# Patient Record
Sex: Male | Born: 1955 | Race: White | Hispanic: No | Marital: Married | Smoking: Former smoker
Health system: Southern US, Community
[De-identification: ages and names within clinical notes are randomized; demographics above are authoritative.]

## PROBLEM LIST (undated history)

## (undated) ENCOUNTER — Encounter: Attending: Internal Medicine | Primary: Internal Medicine

## (undated) ENCOUNTER — Encounter: Attending: Medical | Primary: Medical

## (undated) ENCOUNTER — Ambulatory Visit: Payer: PRIVATE HEALTH INSURANCE

## (undated) ENCOUNTER — Encounter

## (undated) ENCOUNTER — Telehealth

## (undated) ENCOUNTER — Ambulatory Visit

## (undated) ENCOUNTER — Encounter: Attending: Family | Primary: Family

## (undated) ENCOUNTER — Other Ambulatory Visit

## (undated) ENCOUNTER — Telehealth: Attending: Internal Medicine | Primary: Internal Medicine

## (undated) ENCOUNTER — Ambulatory Visit: Payer: MEDICARE | Attending: Family | Primary: Family

## (undated) ENCOUNTER — Encounter
Attending: Student in an Organized Health Care Education/Training Program | Primary: Student in an Organized Health Care Education/Training Program

## (undated) ENCOUNTER — Ambulatory Visit
Payer: MEDICARE | Attending: Student in an Organized Health Care Education/Training Program | Primary: Student in an Organized Health Care Education/Training Program

## (undated) ENCOUNTER — Ambulatory Visit: Attending: Pharmacist | Primary: Pharmacist

## (undated) ENCOUNTER — Encounter: Attending: Mental Health | Primary: Mental Health

## (undated) ENCOUNTER — Ambulatory Visit: Payer: PRIVATE HEALTH INSURANCE | Attending: Family Medicine | Primary: Family Medicine

## (undated) ENCOUNTER — Ambulatory Visit: Payer: PRIVATE HEALTH INSURANCE | Attending: Medical | Primary: Medical

## (undated) ENCOUNTER — Ambulatory Visit
Attending: Student in an Organized Health Care Education/Training Program | Primary: Student in an Organized Health Care Education/Training Program

## (undated) ENCOUNTER — Telehealth
Attending: Student in an Organized Health Care Education/Training Program | Primary: Student in an Organized Health Care Education/Training Program

## (undated) ENCOUNTER — Ambulatory Visit: Payer: MEDICARE | Attending: Internal Medicine | Primary: Internal Medicine

## (undated) ENCOUNTER — Ambulatory Visit: Payer: MEDICARE

## (undated) ENCOUNTER — Ambulatory Visit
Payer: PRIVATE HEALTH INSURANCE | Attending: Rehabilitative and Restorative Service Providers" | Primary: Rehabilitative and Restorative Service Providers"

## (undated) ENCOUNTER — Ambulatory Visit: Attending: Internal Medicine | Primary: Internal Medicine

## (undated) ENCOUNTER — Telehealth: Attending: Medical | Primary: Medical

## (undated) ENCOUNTER — Telehealth: Attending: Physician Assistant | Primary: Physician Assistant

## (undated) ENCOUNTER — Telehealth: Attending: Family | Primary: Family

## (undated) ENCOUNTER — Ambulatory Visit: Payer: MEDICARE | Attending: Physician Assistant | Primary: Physician Assistant

## (undated) ENCOUNTER — Ambulatory Visit: Payer: PRIVATE HEALTH INSURANCE | Attending: Internal Medicine | Primary: Internal Medicine

## (undated) ENCOUNTER — Ambulatory Visit: Payer: PRIVATE HEALTH INSURANCE | Attending: Mental Health | Primary: Mental Health

## (undated) DIAGNOSIS — I2510 Atherosclerotic heart disease of native coronary artery without angina pectoris: Secondary | ICD-10-CM

## (undated) DIAGNOSIS — G2581 Restless legs syndrome: Secondary | ICD-10-CM

## (undated) DIAGNOSIS — E7800 Pure hypercholesterolemia, unspecified: Secondary | ICD-10-CM

## (undated) DIAGNOSIS — M1990 Unspecified osteoarthritis, unspecified site: Secondary | ICD-10-CM

## (undated) DIAGNOSIS — I10 Essential (primary) hypertension: Secondary | ICD-10-CM

## (undated) DIAGNOSIS — R079 Chest pain, unspecified: Secondary | ICD-10-CM

## (undated) DIAGNOSIS — IMO0002 Reserved for concepts with insufficient information to code with codable children: Secondary | ICD-10-CM

## (undated) DIAGNOSIS — Z9861 Coronary angioplasty status: Secondary | ICD-10-CM

## (undated) DIAGNOSIS — Z789 Other specified health status: Secondary | ICD-10-CM

## (undated) DIAGNOSIS — E669 Obesity, unspecified: Secondary | ICD-10-CM

## (undated) DIAGNOSIS — E785 Hyperlipidemia, unspecified: Secondary | ICD-10-CM

## (undated) DIAGNOSIS — N419 Inflammatory disease of prostate, unspecified: Secondary | ICD-10-CM

## (undated) DIAGNOSIS — G4730 Sleep apnea, unspecified: Secondary | ICD-10-CM

## (undated) DIAGNOSIS — I519 Heart disease, unspecified: Secondary | ICD-10-CM

## (undated) DIAGNOSIS — Z79899 Other long term (current) drug therapy: Secondary | ICD-10-CM

## (undated) DIAGNOSIS — K5792 Diverticulitis of intestine, part unspecified, without perforation or abscess without bleeding: Secondary | ICD-10-CM

## (undated) DIAGNOSIS — I252 Old myocardial infarction: Secondary | ICD-10-CM

## (undated) DIAGNOSIS — Z9889 Other specified postprocedural states: Secondary | ICD-10-CM

## (undated) DIAGNOSIS — E119 Type 2 diabetes mellitus without complications: Secondary | ICD-10-CM

## (undated) HISTORY — DX: Other specified postprocedural states: Z98.890

## (undated) HISTORY — DX: Heart disease, unspecified: I51.9

## (undated) HISTORY — DX: Atherosclerotic heart disease of native coronary artery without angina pectoris: I25.10

## (undated) HISTORY — PX: KNEE ARTHROSCOPY: SHX127

## (undated) HISTORY — PX: VASECTOMY: SHX75

## (undated) HISTORY — DX: Unspecified osteoarthritis, unspecified site: M19.90

## (undated) HISTORY — PX: LUMBAR FUSION: SHX111

## (undated) HISTORY — DX: Other long term (current) drug therapy: Z79.899

## (undated) HISTORY — DX: Sleep apnea, unspecified: G47.30

## (undated) HISTORY — DX: Other specified health status: Z78.9

## (undated) HISTORY — DX: Restless legs syndrome: G25.81

## (undated) HISTORY — DX: Inflammatory disease of prostate, unspecified: N41.9

## (undated) HISTORY — PX: OTHER SURGICAL HISTORY: SHX169

## (undated) HISTORY — DX: Obesity, unspecified: E66.9

## (undated) HISTORY — DX: Essential (primary) hypertension: I10

## (undated) HISTORY — DX: Chest pain, unspecified: R07.9

## (undated) HISTORY — DX: Pure hypercholesterolemia, unspecified: E78.00

## (undated) HISTORY — DX: Diverticulitis of intestine, part unspecified, without perforation or abscess without bleeding: K57.92

## (undated) HISTORY — PX: HERNIA REPAIR: SHX51

## (undated) HISTORY — DX: Hyperlipidemia, unspecified: E78.5

## (undated) HISTORY — PX: SHOULDER SURGERY: SHX246

## (undated) HISTORY — DX: Coronary angioplasty status: Z98.61

## (undated) HISTORY — DX: Reserved for concepts with insufficient information to code with codable children: IMO0002

## (undated) HISTORY — DX: Old myocardial infarction: I25.2

## (undated) HISTORY — PX: COLON RESECTION: SHX5231

## (undated) HISTORY — PX: CATARACT EXTRACTION: SUR2

---

## 2004-03-02 HISTORY — PX: OTHER SURGICAL HISTORY: SHX169

## 2010-06-17 ENCOUNTER — Inpatient Hospital Stay (HOSPITAL_COMMUNITY)
Admission: EM | Admit: 2010-06-17 | Discharge: 2010-06-25 | DRG: 234 | Disposition: A | Discharge status: Home / Self Care | Payer: Managed Care, Other (non HMO) | Source: Other Acute Inpatient Hospital | Attending: Thoracic Surgery (Cardiothoracic Vascular Surgery) | Admitting: Thoracic Surgery (Cardiothoracic Vascular Surgery)

## 2010-06-17 ENCOUNTER — Inpatient Hospital Stay (HOSPITAL_COMMUNITY): Payer: Managed Care, Other (non HMO)

## 2010-06-17 DIAGNOSIS — G4733 Obstructive sleep apnea (adult) (pediatric): Secondary | ICD-10-CM | POA: Diagnosis present

## 2010-06-17 DIAGNOSIS — J90 Pleural effusion, not elsewhere classified: Secondary | ICD-10-CM | POA: Diagnosis not present

## 2010-06-17 DIAGNOSIS — M542 Cervicalgia: Secondary | ICD-10-CM | POA: Diagnosis present

## 2010-06-17 DIAGNOSIS — E669 Obesity, unspecified: Secondary | ICD-10-CM | POA: Diagnosis present

## 2010-06-17 DIAGNOSIS — K5900 Constipation, unspecified: Secondary | ICD-10-CM | POA: Diagnosis present

## 2010-06-17 DIAGNOSIS — J9819 Other pulmonary collapse: Secondary | ICD-10-CM | POA: Diagnosis present

## 2010-06-17 DIAGNOSIS — G2581 Restless legs syndrome: Secondary | ICD-10-CM | POA: Diagnosis present

## 2010-06-17 DIAGNOSIS — M1990 Unspecified osteoarthritis, unspecified site: Secondary | ICD-10-CM | POA: Diagnosis present

## 2010-06-17 DIAGNOSIS — Z01810 Encounter for preprocedural cardiovascular examination: Secondary | ICD-10-CM

## 2010-06-17 DIAGNOSIS — E8779 Other fluid overload: Secondary | ICD-10-CM | POA: Diagnosis not present

## 2010-06-17 DIAGNOSIS — D649 Anemia, unspecified: Secondary | ICD-10-CM | POA: Diagnosis not present

## 2010-06-17 DIAGNOSIS — G8929 Other chronic pain: Secondary | ICD-10-CM | POA: Diagnosis present

## 2010-06-17 DIAGNOSIS — Z9861 Coronary angioplasty status: Secondary | ICD-10-CM

## 2010-06-17 DIAGNOSIS — I10 Essential (primary) hypertension: Secondary | ICD-10-CM | POA: Diagnosis present

## 2010-06-17 DIAGNOSIS — Z981 Arthrodesis status: Secondary | ICD-10-CM

## 2010-06-17 DIAGNOSIS — I2510 Atherosclerotic heart disease of native coronary artery without angina pectoris: Secondary | ICD-10-CM | POA: Diagnosis present

## 2010-06-17 DIAGNOSIS — R7309 Other abnormal glucose: Secondary | ICD-10-CM | POA: Diagnosis present

## 2010-06-17 DIAGNOSIS — I252 Old myocardial infarction: Secondary | ICD-10-CM

## 2010-06-17 DIAGNOSIS — Z7982 Long term (current) use of aspirin: Secondary | ICD-10-CM

## 2010-06-17 DIAGNOSIS — E876 Hypokalemia: Secondary | ICD-10-CM | POA: Diagnosis not present

## 2010-06-17 DIAGNOSIS — I519 Heart disease, unspecified: Secondary | ICD-10-CM | POA: Diagnosis present

## 2010-06-17 DIAGNOSIS — E785 Hyperlipidemia, unspecified: Secondary | ICD-10-CM | POA: Diagnosis present

## 2010-06-17 DIAGNOSIS — D72829 Elevated white blood cell count, unspecified: Secondary | ICD-10-CM | POA: Diagnosis not present

## 2010-06-17 DIAGNOSIS — Z8249 Family history of ischemic heart disease and other diseases of the circulatory system: Secondary | ICD-10-CM

## 2010-06-17 DIAGNOSIS — I214 Non-ST elevation (NSTEMI) myocardial infarction: Principal | ICD-10-CM | POA: Diagnosis present

## 2010-06-17 DIAGNOSIS — I319 Disease of pericardium, unspecified: Secondary | ICD-10-CM | POA: Diagnosis not present

## 2010-06-17 DIAGNOSIS — K219 Gastro-esophageal reflux disease without esophagitis: Secondary | ICD-10-CM | POA: Diagnosis present

## 2010-06-17 LAB — URINALYSIS, ROUTINE W REFLEX MICROSCOPIC
Glucose, UA: NEGATIVE mg/dL
Nitrite: NEGATIVE
Specific Gravity, Urine: 1.02 (ref 1.005–1.030)
pH: 6.5 (ref 5.0–8.0)

## 2010-06-17 LAB — BASIC METABOLIC PANEL
BUN: 14 mg/dL (ref 6–23)
CO2: 25 mEq/L (ref 19–32)
Calcium: 9 mg/dL (ref 8.4–10.5)
Creatinine, Ser: 0.88 mg/dL (ref 0.4–1.5)
GFR calc Af Amer: >60 mL/min (ref >60)
Glucose, Bld: 98 mg/dL (ref 70–99)

## 2010-06-17 LAB — CBC
MCH: 30.7 pg (ref 26.0–34.0)
MCHC: 34.9 g/dL (ref 30.0–36.0)
Platelets: 240 10*3/uL (ref 150–400)
RBC: 4.63 MIL/uL (ref 4.22–5.81)
RDW: 12.9 % (ref 11.5–15.5)

## 2010-06-17 LAB — CARDIAC PANEL(CRET KIN+CKTOT+MB+TROPI)
CK, MB: 24.3 ng/mL (ref 0.3–4.0)
Relative Index: 11.4 — ABNORMAL HIGH (ref 0.0–2.5)
Troponin I: 1.44 ng/mL (ref 0.00–0.06)

## 2010-06-17 LAB — PROTIME-INR: Prothrombin Time: 13.2 seconds (ref 11.6–15.2)

## 2010-06-18 ENCOUNTER — Inpatient Hospital Stay (HOSPITAL_COMMUNITY): Payer: Managed Care, Other (non HMO)

## 2010-06-18 ENCOUNTER — Encounter (HOSPITAL_COMMUNITY): Payer: Self-pay

## 2010-06-18 DIAGNOSIS — I2510 Atherosclerotic heart disease of native coronary artery without angina pectoris: Secondary | ICD-10-CM

## 2010-06-18 LAB — SURGICAL PCR SCREEN: MRSA, PCR: NEGATIVE

## 2010-06-18 LAB — COMPREHENSIVE METABOLIC PANEL
ALT: 26 U/L (ref 0–53)
Albumin: 3.4 g/dL — ABNORMAL LOW (ref 3.5–5.2)
Alkaline Phosphatase: 49 U/L (ref 39–117)
BUN: 11 mg/dL (ref 6–23)
Chloride: 109 mEq/L (ref 96–112)
Glucose, Bld: 106 mg/dL — ABNORMAL HIGH (ref 70–99)
Potassium: 3.9 mEq/L (ref 3.5–5.1)
Sodium: 139 mEq/L (ref 135–145)
Total Bilirubin: 0.7 mg/dL (ref 0.3–1.2)

## 2010-06-18 LAB — CBC
HCT: 39.5 % (ref 39.0–52.0)
MCH: 30.2 pg (ref 26.0–34.0)
MCHC: 34.4 g/dL (ref 30.0–36.0)
MCV: 87.8 fL (ref 78.0–100.0)
Platelets: 243 10*3/uL (ref 150–400)
RDW: 12.8 % (ref 11.5–15.5)
WBC: 7.8 10*3/uL (ref 4.0–10.5)

## 2010-06-18 LAB — LIPID PANEL
HDL: 35 mg/dL — ABNORMAL LOW (ref >39)
LDL Cholesterol: 109 mg/dL — ABNORMAL HIGH (ref 0–99)
Triglycerides: 234 mg/dL — ABNORMAL HIGH (ref <150)

## 2010-06-18 LAB — CARDIAC PANEL(CRET KIN+CKTOT+MB+TROPI): Total CK: 186 U/L (ref 7–232)

## 2010-06-18 NOTE — Consult Note (Signed)
NAME:  [name], [name]                 ACCOUNT NO.:  [id number]  MEDICAL RECORD NO.:  [id number]           PATIENT TYPE:  I  LOCATION:  2904                         FACILITY:  [facility]  PHYSICIAN:  [name], M.D. DATE OF BIRTH:  1955-09-24  DATE OF CONSULTATION:  06/17/2010 DATE OF DISCHARGE:                                CONSULTATION   REQUESTING PHYSICIAN:  Peter M. Swaziland, MD  REASON FOR CONSULTATION:  Severe three-vessel coronary artery disease, status post acute non-ST-segment elevation myocardial infarction.  HISTORY OF PRESENT ILLNESS:  [name] is a 55 year old obese white male who recently moved to [place], [place] having previously resided in [place].  The patient has history of coronary artery disease, hypertension, hyperlipidemia, hyperglycemia, and a strong family history of coronary artery disease.  The patient states that he suffered an acute myocardial infarction in 2004 and was treated medically.  He had a second myocardial infarction in 2006 and underwent PCI and stenting of one vessel at that time.  Details of these procedures were not available.  The patient has sought regular medical followup since then and underwent followup catheterization in 2010 that reportedly looked okay.  The patient was in his usual state of health until approximately 1 week ago when he began to develop symptoms of exertional chest pain and worsening fatigue.  These symptoms accelerated rapidly over the course of the week until last night when he was awoke from his sleep with severe substernal chest discomfort occurring at rest.  He took aspirin and nitroglycerin and eventually the chest pain resolved.  He presented to the emergency department at [facility] where electrocardiograms revealed sinus rhythm without any acute ST-T wave changes.  However, cardiac enzymes were abnormal prompting hospitalization and transfer to [facility] for further therapy.   The patient underwent cardiac catheterization today by Dr. Swaziland.  The patient was found to have severe three-vessel coronary artery disease with mild left ventricular dysfunction.  Cardiothoracic surgical consultation was requested.  REVIEW OF SYSTEMS:  GENERAL:  The patient reports normal appetite.  He has been feeling well up until approximately 1 week ago.  CARDIAC: Notable for symptoms of accelerating angina over the past week culminating in the patient's acute presentation last night.  The patient also had one episode of chest pain at rest here following catheterization later this afternoon.  Chest pain was resolved with sublingual nitroglycerin and intravenous heparin.  The patient denies any problems with shortness of breath either with exertion or at rest prior to his acute presentation.  The patient denies PND, orthopnea, or lower extremity edema.  He has not had tachy palpitations or syncope. RESPIRATORY:  Negative.  The patient has been treated for obstructive sleep apnea in the past.  He does not use CPAP.  He denies productive cough, hemoptysis, or wheezing.  GASTROINTESTINAL:  Notable that the patient has no difficulty swallowing.  He reports normal appetite.  He has not been having normal bowel movements.  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  The patient has been having bowel movements and he has not had any fevers, chills, or other significant abdominal pain other than mild occasional crampy pain associated with constipation.  He does have intermittent bright red blood in his stool which apparently is chronic and related to history of hemorrhoids. MUSCULOSKELETAL:  Notable for mild arthritis and arthralgias, particularly afflicting his neck.   GENITOURINARY:  Negative.  HEENT: Negative.  PAST MEDICAL HISTORY: 1. Coronary artery disease. 2. Hypertension. 3. Hyperlipidemia. 4. Hyperglycemia. 5. 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 6. Osteoarthritis. 7. History of ventral incisional hernia, status post repair. 8. Obstructive sleep apnea. 9. Restless legs syndrome. 10.Obesity.  PAST SURGICAL HISTORY: 1. Cervical fusion at C6-C7 2. Left shoulder surgery. 3. Right elbow surgery. 4. Tonsillectomy and uvulectomy.  FAMILY HISTORY:  Notable for strong presence of premature coronary artery disease.  The patient's father died of myocardial infarction at a young age and brother has undergone coronary artery bypass grafting at young age.  SOCIAL HISTORY:  The patient is married, with two children, one who is in college and Theatre stage manager and another who is a Holiday representative in high school.  The patient has just recently relocated to [place], [place] having previously lived in [place].  He works as a Advice worker for [employer].  He has a remote history of tobacco use, but he quit smoking 25 years ago.  MEDICATIONS PRIOR TO ADMISSION: 1. Bystolic 2.5 mg daily. 2. Aspirin.  DRUG ALLERGIES:  FLAGYL with causes hives.  The patient has had some intolerance of statins in the past.  PHYSICAL EXAMINATION:  GENERAL:  The patient is a well-appearing moderately obese male who appears his stated age, in no acute distress. He is currently in sinus rhythm. HEENT:  Unrevealing. NECK:  Supple.  There are no carotid bruits. CHEST:  Auscultation of the chest reveals clear breath sounds which are symmetrical bilaterally.  No wheezes, rales, or rhonchi noted. CARDIOVASCULAR:  Notable for regular rate and rhythm.  No murmurs, rubs, or gallops are appreciated. ABDOMEN:  Soft, moderately obese,  AND nontender.  Bowel sounds are present. EXTREMITIES:  Warm and well perfused.  There is no lower extremity edema.  Distal pulses are easily palpable in both lower legs at the ankle. RECTAL AND GU:  Both deferred. SKIN:  Clean, dry, and healthy-appearing throughout.  DIAGNOSTIC TEST:  Cardiac catheterization performed by Dr. Swaziland is reviewed.  This demonstrates 99% hazy proximal stenosis of the left anterior descending coronary artery arising just after takeoff of the first septal perforator.  There is diffuse disease in the mid and distal left anterior descending coronary artery as well.  There is codominant circulation with a large left circumflex coronary artery.  There is proximal stent in the left circumflex vessel.  There is a 70-80% ostial stenosis of a small to medium size intermediate branch.  There is 80% proximal stenosis of a large obtuse marginal branch.  There is tubular 50% stenosis of the mid left circumflex coronary artery before it gives rise to posterolateral branch.  The right coronary artery is small and chronically occluded.  The terminal branch of the right coronary artery fills faintly via left-to-right collaterals.  Left ventricular function is mild to moderately reduced with severe apical hypokinesis,  but otherwise normal wall motion.  Ejection fraction estimated at 50%.  IMPRESSION:  Severe three-vessel coronary artery disease, status post acute non-ST-segment elevation myocardial infarction.  I agree that [name] would best be treated with surgical revascularization.  The patient also has some symptoms of constipation and obstipation which could be suggestive of partial small or large bowel obstruction given his previous history of severe diverticular disease and multiple surgical procedures.  His abdominal exam is completely benign at this time and admission blood work was normal.  PLAN:  I have discussed options at length with [name] here in  the hospital today.  He understands the indications, risks, and potential benefits of coronary artery bypass grafting.  Alternative treatment strategies have been discussed.  He has asked that his records be sent out for second opinion, but at this point the patient seems to be agreeable in proceeding with surgery.  We will tentatively plan to proceed with surgery on Friday, June 20, 2010.  He understands all associated risks of surgery including but not limited to risk of death, stroke, myocardial infarction, congestive heart failure, respiratory failure, pneumonia, bleeding requiring blood transfusion, arrhythmia, infection, and recurrent coronary artery disease.  All of his questions have been addressed.     [name], M.D.     CHO/MEDQ  D:  06/17/2010  T:  06/18/2010  Job:  [id number]  cc:   Cassell Clement, M.D.  Electronically Signed by Tressie Stalker M.D. on 06/18/2010 08:13:10 AM

## 2010-06-19 DIAGNOSIS — I2510 Atherosclerotic heart disease of native coronary artery without angina pectoris: Secondary | ICD-10-CM

## 2010-06-19 LAB — CBC
HCT: 38.8 % — ABNORMAL LOW (ref 39.0–52.0)
MCH: 30 pg (ref 26.0–34.0)
MCHC: 34.3 g/dL (ref 30.0–36.0)
MCV: 87.4 fL (ref 78.0–100.0)
Platelets: 242 10*3/uL (ref 150–400)
RDW: 12.7 % (ref 11.5–15.5)
WBC: 6.9 10*3/uL (ref 4.0–10.5)

## 2010-06-19 LAB — POCT I-STAT 3, ART BLOOD GAS (G3+)
Acid-Base Excess: 3 mmol/L — ABNORMAL HIGH (ref 0.0–2.0)
Bicarbonate: 26.5 mEq/L — ABNORMAL HIGH (ref 20.0–24.0)
TCO2: 28 mmol/L (ref 0–100)

## 2010-06-19 LAB — ABO/RH: ABO/RH(D): A POS

## 2010-06-19 LAB — HEPARIN LEVEL (UNFRACTIONATED): Heparin Unfractionated: 0.41 IU/mL (ref 0.30–0.70)

## 2010-06-19 LAB — TYPE AND SCREEN: Antibody Screen: NEGATIVE

## 2010-06-20 ENCOUNTER — Inpatient Hospital Stay (HOSPITAL_COMMUNITY): Payer: Managed Care, Other (non HMO)

## 2010-06-20 DIAGNOSIS — I2510 Atherosclerotic heart disease of native coronary artery without angina pectoris: Secondary | ICD-10-CM

## 2010-06-20 HISTORY — PX: CORONARY ARTERY BYPASS GRAFT: SHX141

## 2010-06-20 LAB — POCT I-STAT 3, ART BLOOD GAS (G3+)
Acid-base deficit: 1 mmol/L (ref 0.0–2.0)
Acid-base deficit: 2 mmol/L (ref 0.0–2.0)
Bicarbonate: 25 mEq/L — ABNORMAL HIGH (ref 20.0–24.0)
O2 Saturation: 100 %
O2 Saturation: 92 %
TCO2: 24 mmol/L (ref 0–100)
pCO2 arterial: 38.1 mmHg (ref 35.0–45.0)
pCO2 arterial: 37.6 mmHg (ref 35.0–45.0)
pCO2 arterial: 46.8 mmHg — ABNORMAL HIGH (ref 35.0–45.0)
pCO2 arterial: 46.1 mmHg — ABNORMAL HIGH (ref 35.0–45.0)
pO2, Arterial: 331 mmHg — ABNORMAL HIGH (ref 80.0–100.0)
pO2, Arterial: 65 mmHg — ABNORMAL LOW (ref 80.0–100.0)
pO2, Arterial: 74 mmHg — ABNORMAL LOW (ref 80.0–100.0)
pO2, Arterial: 70 mmHg — ABNORMAL LOW (ref 80.0–100.0)

## 2010-06-20 LAB — BASIC METABOLIC PANEL
Calcium: 9.1 mg/dL (ref 8.4–10.5)
Creatinine, Ser: 1.04 mg/dL (ref 0.4–1.5)
GFR calc Af Amer: >60 mL/min (ref >60)
GFR calc non Af Amer: >60 mL/min (ref >60)

## 2010-06-20 LAB — POCT I-STAT, CHEM 8
BUN: 12 mg/dL (ref 6–23)
Calcium, Ion: 1.18 mmol/L (ref 1.12–1.32)
Chloride: 104 mEq/L (ref 96–112)
Glucose, Bld: 120 mg/dL — ABNORMAL HIGH (ref 70–99)
HCT: 36 % — ABNORMAL LOW (ref 39.0–52.0)
TCO2: 25 mmol/L (ref 0–100)

## 2010-06-20 LAB — CBC
HCT: 36 % — ABNORMAL LOW (ref 39.0–52.0)
MCH: 30.4 pg (ref 26.0–34.0)
MCH: 31.1 pg (ref 26.0–34.0)
MCHC: 34.3 g/dL (ref 30.0–36.0)
MCHC: 34.4 g/dL (ref 30.0–36.0)
MCV: 86.8 fL (ref 78.0–100.0)
MCV: 87.8 fL (ref 78.0–100.0)
Platelets: 251 10*3/uL (ref 150–400)
Platelets: 155 10*3/uL (ref 150–400)
Platelets: 156 10*3/uL (ref 150–400)
RDW: 12.9 % (ref 11.5–15.5)
RDW: 12.6 % (ref 11.5–15.5)
RDW: 12.7 % (ref 11.5–15.5)

## 2010-06-20 LAB — POCT I-STAT 4, (NA,K, GLUC, HGB,HCT)
Glucose, Bld: 113 mg/dL — ABNORMAL HIGH (ref 70–99)
Glucose, Bld: 147 mg/dL — ABNORMAL HIGH (ref 70–99)
Glucose, Bld: 166 mg/dL — ABNORMAL HIGH (ref 70–99)
HCT: 38 % — ABNORMAL LOW (ref 39.0–52.0)
HCT: 29 % — ABNORMAL LOW (ref 39.0–52.0)
HCT: 31 % — ABNORMAL LOW (ref 39.0–52.0)
HCT: 35 % — ABNORMAL LOW (ref 39.0–52.0)
Hemoglobin: 12.9 g/dL — ABNORMAL LOW (ref 13.0–17.0)
Hemoglobin: 11.9 g/dL — ABNORMAL LOW (ref 13.0–17.0)
Potassium: 4.8 mEq/L (ref 3.5–5.1)
Sodium: 137 mEq/L (ref 135–145)

## 2010-06-20 LAB — HEMOGLOBIN AND HEMATOCRIT, BLOOD
HCT: 30.3 % — ABNORMAL LOW (ref 39.0–52.0)
Hemoglobin: 10.6 g/dL — ABNORMAL LOW (ref 13.0–17.0)

## 2010-06-20 LAB — DIFFERENTIAL
Basophils Absolute: 0 10*3/uL (ref 0.0–0.1)
Basophils Relative: 0 % (ref 0–1)
Eosinophils Absolute: 0.5 10*3/uL (ref 0.0–0.7)
Monocytes Absolute: 0.9 10*3/uL (ref 0.1–1.0)
Monocytes Relative: 11 % (ref 3–12)

## 2010-06-20 LAB — POCT I-STAT 3, VENOUS BLOOD GAS (G3P V)
O2 Saturation: 74 %
TCO2: 26 mmol/L (ref 0–100)
pCO2, Ven: 45.5 mmHg (ref 45.0–50.0)
pO2, Ven: 41 mmHg (ref 30.0–45.0)

## 2010-06-20 LAB — POCT I-STAT GLUCOSE
Glucose, Bld: 162 mg/dL — ABNORMAL HIGH (ref 70–99)
Operator id: 3342
Operator id: 3342

## 2010-06-21 ENCOUNTER — Inpatient Hospital Stay (HOSPITAL_COMMUNITY): Payer: Managed Care, Other (non HMO)

## 2010-06-21 LAB — BASIC METABOLIC PANEL
BUN: 12 mg/dL (ref 6–23)
CO2: 24 mEq/L (ref 19–32)
Chloride: 107 mEq/L (ref 96–112)
Creatinine, Ser: 1.05 mg/dL (ref 0.4–1.5)
Glucose, Bld: 130 mg/dL — ABNORMAL HIGH (ref 70–99)

## 2010-06-21 LAB — GLUCOSE, CAPILLARY
Glucose-Capillary: 100 mg/dL — ABNORMAL HIGH (ref 70–99)
Glucose-Capillary: 103 mg/dL — ABNORMAL HIGH (ref 70–99)
Glucose-Capillary: 106 mg/dL — ABNORMAL HIGH (ref 70–99)
Glucose-Capillary: 111 mg/dL — ABNORMAL HIGH (ref 70–99)
Glucose-Capillary: 118 mg/dL — ABNORMAL HIGH (ref 70–99)
Glucose-Capillary: 119 mg/dL — ABNORMAL HIGH (ref 70–99)
Glucose-Capillary: 124 mg/dL — ABNORMAL HIGH (ref 70–99)
Glucose-Capillary: 129 mg/dL — ABNORMAL HIGH (ref 70–99)
Glucose-Capillary: 130 mg/dL — ABNORMAL HIGH (ref 70–99)
Glucose-Capillary: 136 mg/dL — ABNORMAL HIGH (ref 70–99)
Glucose-Capillary: 142 mg/dL — ABNORMAL HIGH (ref 70–99)
Glucose-Capillary: 174 mg/dL — ABNORMAL HIGH (ref 70–99)
Glucose-Capillary: 93 mg/dL (ref 70–99)
Glucose-Capillary: 98 mg/dL (ref 70–99)

## 2010-06-21 LAB — POCT I-STAT, CHEM 8
Creatinine, Ser: 1.2 mg/dL (ref 0.4–1.5)
Hemoglobin: 12.2 g/dL — ABNORMAL LOW (ref 13.0–17.0)
Sodium: 137 mEq/L (ref 135–145)
TCO2: 26 mmol/L (ref 0–100)

## 2010-06-21 LAB — CBC
HCT: 35.2 % — ABNORMAL LOW (ref 39.0–52.0)
Hemoglobin: 11.8 g/dL — ABNORMAL LOW (ref 13.0–17.0)
MCH: 30.5 pg (ref 26.0–34.0)
MCHC: 34.1 g/dL (ref 30.0–36.0)
MCV: 88.1 fL (ref 78.0–100.0)
RBC: 3.87 MIL/uL — ABNORMAL LOW (ref 4.22–5.81)
RDW: 13.3 % (ref 11.5–15.5)

## 2010-06-21 LAB — CREATININE, SERUM: Creatinine, Ser: 1.1 mg/dL (ref 0.4–1.5)

## 2010-06-22 ENCOUNTER — Inpatient Hospital Stay (HOSPITAL_COMMUNITY): Payer: Managed Care, Other (non HMO)

## 2010-06-22 LAB — CBC
Hemoglobin: 9.6 g/dL — ABNORMAL LOW (ref 13.0–17.0)
MCH: 30.2 pg (ref 26.0–34.0)
RBC: 3.18 MIL/uL — ABNORMAL LOW (ref 4.22–5.81)
WBC: 12.8 10*3/uL — ABNORMAL HIGH (ref 4.0–10.5)

## 2010-06-22 LAB — BASIC METABOLIC PANEL
BUN: 12 mg/dL (ref 6–23)
Calcium: 8.2 mg/dL — ABNORMAL LOW (ref 8.4–10.5)
Creatinine, Ser: 0.87 mg/dL (ref 0.4–1.5)
GFR calc Af Amer: >60 mL/min (ref >60)
GFR calc non Af Amer: >60 mL/min (ref >60)
Glucose, Bld: 113 mg/dL — ABNORMAL HIGH (ref 70–99)
Sodium: 136 mEq/L (ref 135–145)

## 2010-06-22 LAB — POCT I-STAT 3, ART BLOOD GAS (G3+)
Acid-Base Excess: 6 mmol/L — ABNORMAL HIGH (ref 0.0–2.0)
pCO2 arterial: 55.8 mmHg — ABNORMAL HIGH (ref 35.0–45.0)
pH, Arterial: 7.371 (ref 7.350–7.450)
pO2, Arterial: 62 mmHg — ABNORMAL LOW (ref 80.0–100.0)

## 2010-06-22 LAB — GLUCOSE, CAPILLARY
Glucose-Capillary: 142 mg/dL — ABNORMAL HIGH (ref 70–99)
Glucose-Capillary: 119 mg/dL — ABNORMAL HIGH (ref 70–99)

## 2010-06-23 ENCOUNTER — Inpatient Hospital Stay (HOSPITAL_COMMUNITY): Payer: Managed Care, Other (non HMO)

## 2010-06-23 LAB — BASIC METABOLIC PANEL
CO2: 35 mEq/L — ABNORMAL HIGH (ref 19–32)
Chloride: 96 mEq/L (ref 96–112)
GFR calc non Af Amer: >60 mL/min (ref >60)
Glucose, Bld: 136 mg/dL — ABNORMAL HIGH (ref 70–99)
Potassium: 4.2 mEq/L (ref 3.5–5.1)
Sodium: 139 mEq/L (ref 135–145)

## 2010-06-23 LAB — GLUCOSE, CAPILLARY
Glucose-Capillary: 113 mg/dL — ABNORMAL HIGH (ref 70–99)
Glucose-Capillary: 121 mg/dL — ABNORMAL HIGH (ref 70–99)
Glucose-Capillary: 141 mg/dL — ABNORMAL HIGH (ref 70–99)

## 2010-06-23 LAB — CBC
HCT: 35.7 % — ABNORMAL LOW (ref 39.0–52.0)
Hemoglobin: 12 g/dL — ABNORMAL LOW (ref 13.0–17.0)
RBC: 3.92 MIL/uL — ABNORMAL LOW (ref 4.22–5.81)
RDW: 13.5 % (ref 11.5–15.5)
WBC: 16.2 10*3/uL — ABNORMAL HIGH (ref 4.0–10.5)

## 2010-06-23 NOTE — Op Note (Signed)
NAME:  [name], [name]                 ACCOUNT NO.:  [id number]  MEDICAL RECORD NO.:  [id number]           PATIENT TYPE:  I  LOCATION:  2306                         FACILITY:  [facility]  PHYSICIAN:  [name], M.D. DATE OF BIRTH:  10-22-1955  DATE OF PROCEDURE:  06/20/2010 DATE OF DISCHARGE:                              OPERATIVE REPORT   PREOPERATIVE DIAGNOSIS:  Severe three-vessel coronary artery disease.  POSTOPERATIVE DIAGNOSIS:  Severe three-vessel coronary artery disease.  PROCEDURES:  Median sternotomy for coronary artery bypass grafting x4 (left internal mammary artery to distal left anterior descending coronary artery, saphenous vein graft to ramus intermediate branch, saphenous vein graft to obtuse marginal branch of the left circumflex coronary artery, saphenous vein graft to left posterolateral branch, endoscopic saphenous vein harvest from right thigh and right lower leg).  SURGEON:  [name], MD  ASSISTANT:  Coral Ceo, PA  ANESTHESIA:  General endotracheal anesthesia, Dr. Sharee Holster.  BRIEF CLINICAL NOTE:  The patient is a 55 year old obese white male with known history of coronary artery disease, hypertension, hyperlipidemia, strong family history of coronary artery disease, and type 2 diabetes mellitus.  The patient presents with unstable symptoms of chest pain and was admitted to the hospital and ruled in for an acute non-ST-segment elevation myocardial infarction.  Cardiac catheterization demonstrates severe three-vessel coronary artery disease with preserved left ventricular function.  A full consultation note has been dictated previously.  The patient has been counseled at length regarding the indications, risks, and potential benefits of surgery.  Alternative treatment strategies have been discussed.  He understands and accepts all associated risks of surgery and desires to proceed as described.  OPERATIVE FINDINGS: 1. Normal left  ventricular function. 2. Diffuse coronary artery disease with diffuse plaque throughout all     of the epicardial coronary arteries. 3. Good-quality left internal mammary artery and saphenous vein     conduit for grafting.  OPERATIVE PROCEDURE IN DETAIL:  The patient was brought to the operating room on the above-mentioned date and placed in the supine position on the operating table.  Central monitoring was established by the anesthesia team under the care and direction of Dr. Sharee Holster. Specifically, a Swan-Ganz catheter was placed through the right internal jugular approach.  A radial arterial line was placed.  Intravenous antibiotics were administered.  Following induction of general endotracheal anesthesia, Foley catheter was placed.  The patient's chest, abdomen, both groins, and both lower extremities were prepared and draped in sterile manner.  Baseline transesophageal echocardiogram was performed by Dr. Jacklynn Bue.  This demonstrates normal left ventricular function.  No other abnormalities were noted.  A median sternotomy incision was performed in the left internal mammary artery, dissected from the chest wall, and prepared for bypass grafting. The left internal mammary artery was good-quality conduit. Simultaneously saphenous vein was obtained from the patient's right thigh and the upper portion of the right lower leg using endoscopic vein harvest technique.  The saphenous vein was good-quality conduit.  After the saphenous vein has been removed, a small incision just above the right knee was closed with absorbable suture.  Following systemic heparinization, the left internal mammary artery was transected distally and noted to have excellent flow.  The pericardium was opened.  The ascending aorta was normal in appearance.  The ascending aorta and the right atrium were cannulated for cardiopulmonary bypass.  Cardiopulmonary bypass was begun and the surface of the heart  was inspected.  Distal target vessels were selected for coronary bypass grafting.  There was diffuse coronary artery disease with visible plaque in all the epicardial coronary vessels.  A temperature probe was placed in the left ventricular septum and a cardioplegic cannula was placed in the ascending aorta.  The patient was allowed to cool passively to 32 degrees systemic temperature.  The aortic crossclamp was applied and cold blood cardioplegia was delivered in an antegrade fashion through the aortic root.  Initial cardioplegic arrest was rapid with early diastolic arrest.  Iced saline flush was applied for topical hypothermia.  Repeat doses of cardioplegia were administered intermittently throughout the entire crossclamp portion of the operation through the aortic root and down the subsequently placed vein grafts to maintain completely flat electrocardiogram and left ventricular septal myocardial temperature below 15 degrees centigrade.  The following distal coronary anastomoses were performed: 1. The posterolateral branch of the distal left circumflex coronary     artery is grafted with a saphenous vein graft in an end-to-side     fashion.  This vessel measured 1.5 mm in diameter and is a fair-     quality target vessel for grafting.  The posterior descending     coronary artery is too small and diffusely diseased for grafting. 2. The obtuse marginal branch of the left circumflex coronary artery     is grafted with saphenous vein graft in an end-to-side fashion.     This vessel measures 1.5 mm in diameter and is a fair-quality     target vessel with diffuse plaque throughout this vessel. 3. The intermediate branch is grafted with saphenous vein graft in an     end-to-side fashion.  This vessel measured 1.4 mm in diameter and     is diffusely diseased.  It is a fair-to-poor quality target vessel     for grafting. 4. The distal left anterior descending coronary artery is grafted  with     left internal mammary artery in an end-to-side fashion.  This     vessel measures 1.5 mm in diameter and is a fair to good quality     target vessel for grafting.  It is diffusely diseased proximally     and distally.  The proximal anastomoses for 2 of the 3 vein grafts were performed directly to the ascending aorta including vein graft to the ramus intermediate branch and the vein graft to posterolateral branch.  The vein graft to the obtuse marginal branch is piggyback in a Y fashion onto the vein placed to the intermediate branch due to inadequate length to reach the aorta directly.  The left ventricular septal temperature rises rapidly with reperfusion of the left internal mammary artery.  The aortic crossclamp was removed after total crossclamp time of 74 minutes.  The heart began to beat spontaneously without need for cardioversion. All proximal and distal coronary anastomoses were inspected for hemostasis and appropriate graft orientation.  Epicardial pacing wires were fixed to the right ventricular free wall into the right atrial appendage.  The patient was rewarmed to 37 degrees centigrade temperature.  The patient was weaned from cardiopulmonary bypass without difficulty.  The patient's rhythm at separation  from bypass was sinus bradycardia.  AV sequential pacing was employed.  Total cardiopulmonary bypass time of the operation was 112 minutes.  No inotropic support was required.  Followup transesophageal echocardiogram performed by Dr. Jacklynn Bue after separation from bypass demonstrates normal left ventricular function.  The venous and arterial cannulae were removed uneventfully.  Protamine was administered to reverse the anticoagulation.  The mediastinum and the left pleural space were irrigated with saline solution containing vancomycin.  Meticulous surgical hemostasis ascertained.  Mediastinum and the left pleural space were drained with three chest tubes  placed through separate stab incisions inferiorly.  The pericardium and soft tissues anterior to the aorta were reapproximated loosely.  The sternum was closed using double-strength sternal wire.  The soft tissues anterior to the sternum were closed in multiple layers and the skin was closed with a running subcuticular skin closure.  The patient tolerated the procedure well and was transported to the surgical intensive care unit in a stable condition.  There were no intraoperative complications.  All sponge, instrument, and needle counts were verified correct at the completion of the operation.  No blood products were administered.     [name], M.D.     CHO/MEDQ  D:  06/20/2010  T:  06/21/2010  Job:  [id number]  cc:   Peter M. Swaziland, M.D. Cassell Clement, M.D.  Electronically Signed by Tressie Stalker M.D. on 06/23/2010 12:06:44 PM

## 2010-06-24 LAB — CBC
MCH: 30.7 pg (ref 26.0–34.0)
MCHC: 33.8 g/dL (ref 30.0–36.0)
MCV: 90.8 fL (ref 78.0–100.0)
Platelets: 242 10*3/uL (ref 150–400)
RBC: 4.14 MIL/uL — ABNORMAL LOW (ref 4.22–5.81)
RDW: 13.4 % (ref 11.5–15.5)

## 2010-06-24 LAB — GLUCOSE, CAPILLARY
Glucose-Capillary: 179 mg/dL — ABNORMAL HIGH (ref 70–99)
Glucose-Capillary: 137 mg/dL — ABNORMAL HIGH (ref 70–99)
Glucose-Capillary: 107 mg/dL — ABNORMAL HIGH (ref 70–99)

## 2010-06-24 LAB — BASIC METABOLIC PANEL
BUN: 18 mg/dL (ref 6–23)
Calcium: 8.8 mg/dL (ref 8.4–10.5)
Creatinine, Ser: 0.87 mg/dL (ref 0.4–1.5)
GFR calc Af Amer: >60 mL/min (ref >60)
GFR calc non Af Amer: >60 mL/min (ref >60)

## 2010-06-24 LAB — URINE CULTURE
Colony Count: NO GROWTH
Culture: NO GROWTH

## 2010-06-25 LAB — BASIC METABOLIC PANEL
BUN: 17 mg/dL (ref 6–23)
CO2: 31 mEq/L (ref 19–32)
Calcium: 9.6 mg/dL (ref 8.4–10.5)
Creatinine, Ser: 0.93 mg/dL (ref 0.4–1.5)
GFR calc non Af Amer: >60 mL/min (ref >60)
Glucose, Bld: 117 mg/dL — ABNORMAL HIGH (ref 70–99)
Sodium: 141 mEq/L (ref 135–145)

## 2010-06-29 NOTE — Cardiovascular Report (Signed)
  NAME:  [name], [name]                 ACCOUNT NO.:  [id number]  MEDICAL RECORD NO.:  [id number]           PATIENT TYPE:  I  LOCATION:  2904                         FACILITY:  [facility]  PHYSICIAN:  Vangie Henthorn M. Swaziland, M.D.  DATE OF BIRTH:  [birth year]  DATE OF PROCEDURE: DATE OF DISCHARGE:                           CARDIAC CATHETERIZATION   INDICATIONS FOR PROCEDURE:  A 55 year old white male with history of hypertension, hyperlipidemia.  He has known coronary disease with prior stent of the left circumflex coronary artery in 2006.  He presents with a non-Q-wave myocardial infarction.  PROCEDURE:  Left heart catheterization, coronary and left ventricular angiography access via the right radial artery using standard Seldinger technique.  EQUIPMENT:  5-French 4-cm right Judkins catheter, 5-French 3.5-cm left Judkins catheter, 5-French pigtail catheter, 5-French arterial sheath  MEDICATIONS:  Local anesthesia 1% Xylocaine, Versed 2 mg IV, fentanyl 25 mcg IV, verapamil 3 mg intra-arterial, heparin 4000 units IV.  CONTRAST:  80 mL of Omnipaque.  HEMODYNAMIC DATA:  Aortic pressure is 109/79 with a mean of 94 mmHg. Left ventricle pressure is 111 with EDP of 24 mmHg.  ANGIOGRAPHIC DATA:  The left coronary artery arises and distributes in a codominant fashion.  The left main coronary artery is normal.  The left anterior descending artery has a 99% proximal stenosis with TIMI grade 2 flow.  Following this, there is a long segment of 70% disease in the midvessel.  There is a ramus intermediate branch which is moderate in size which has an 80% ostial stenosis.  The left circumflex coronary is a codominant vessel giving off a large first obtuse marginal vessel and then terminating into a moderate-sized posterolateral branches.  There is a stent noted in the proximal circumflex with 20-30% irregularities within the stent.  The mid left circumflex has a long 50-60% stenosis.  The first obtuse  marginal vessel is a large branch and has an 80% stenosis proximally.  The right coronary artery is occluded at the ostium.  There are left-to- right collaterals to the distal right coronary.  Left ventricular angiography was performed in the RAO view.  This demonstrates normal left ventricular size with severe apical hypokinesis.  Overall, ejection fraction is estimated at 50%.  FINAL INTERPRETATION: 1. Severe three-vessel obstructive atherosclerotic coronary artery     disease. 2. Mild left ventricular dysfunction.  PLAN:  Based on his complex coronary anatomy and multivessel disease,  I would recommend coronary artery bypass surgery.          ______________________________ Alyha Marines M. Swaziland, M.D.     PMJ/MEDQ  D:  06/17/2010  T:  06/18/2010  Job:  [id number]  cc:   Cassell Clement, M.D.  Electronically Signed by Marijo Quizon Swaziland M.D. on 06/29/2010 12:40:00 PM

## 2010-07-01 ENCOUNTER — Telehealth: Payer: Self-pay | Admitting: *Deleted

## 2010-07-01 NOTE — H&P (Signed)
NAME:  [name], [name]                 ACCOUNT NO.:  [id number]  MEDICAL RECORD NO.:  [id number]           PATIENT TYPE:  I  LOCATION:  2904                         FACILITY:  [facility]  PHYSICIAN:  Cassell Clement, M.D. DATE OF BIRTH:  [birth year]  DATE OF ADMISSION:  06/17/2010 DATE OF DISCHARGE:                             HISTORY & PHYSICAL   PRIMARY CARDIOLOGIST:  New to [facility] and will follow up in [place].  PATIENT PROFILE:  A 55 year old male with prior history of coronary artery disease status post MI x2 who presents with non-ST segment elevation myocardial infarction.  PROBLEMS: 1. Non-ST-segment elevation myocardial infarction/coronary artery     disease.     a.     Status post myocardial infarction in 2004 with      catheterization revealing total occlusion of the vessel with      collateral flow.  The patient was medically managed.     b.     Status post myocardial infarction in 2006 revealing new      occlusive disease requiring stent placement.  The patient believes      he may have only been on Plavix for about a month?  Bare metal      stent.     c.     Abnormal Myoview in 2010.     d.     Cardiac catheterization 2010 showing patent stent.      Medically managed. 2. Hypertension. 3. Hyperlipidemia with history of statin intolerance. 4. History of diverticulitis and adhesions.     a.     Status post colon resection with colostomy and subsequent      reversal. 5. Osteoarthritis. 6. Degenerative disk disease.     a.     Status post fusion of C6-C7. 7. Status post left shoulder reconstruction surgery x2. 8. Obstructive sleep apnea. 9. Status post tonsillectomy and uvulectomy. 10.Status post hernia repair. 11.Status post vasectomy.  ALLERGIES:  FLAGYL causes hives, STATIN causes fatigue and myalgias as well as memory loss.  HISTORY OF PRESENT ILLNESS:  A 55 year old male with the above problem list.  The patient recently moved to [place], [place]  from [place] for his career.  He has prior history of coronary artery disease status post MI x2 with stenting in 2006 and last catheterization in 2010 showing patency of that stent.  He has been medically managed. Over the past week, the patient has had increasing frequency of substernal chest pressure similar to previous angina associated with dyspnea and occasional diaphoresis and nausea.  The patient had multiple episodes in a day while laying out yard mulch and took aspirin with eventual relief.  Last night, the patient had three episodes of nocturnal discomfort.  After the first two, he took an aspirin with relief and that after the third, which was more severe and radiated across his chest and into his left shoulder associated with nausea, the patient took nitroglycerin with immediate relief.  This was concerning to him as it was very similar to previous anginal symptoms and he presented to [facility].  There, he was pain-free and was  placed on heparin and nitroglycerin paste.  He was also given two baby aspirin.  He has since been found to have elevated cardiac markers with a CK-MB of 26.8 and troponin-I of 1.24.  The patient was transferred to [facility] for further evaluation and remains pain-free.  HOME MEDICATIONS: 1. Bystolic 2.5 mg daily, 2. Aspirin 325 mg daily.  FAMILY HISTORY:  Mother is alive and well at 24.  Father died an MI at 66.  He has a 56 year old brother who had his first MI at 67.  SOCIAL HISTORY:  The patient currently is living in [place] by himself though his wife and children remained at [place] until he can sell that house there.  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  He has a 7-8 pack-year history of tobacco abuse quitting 25 years ago.  Drinks a few beers a month.  Denies drug use. He is not routinely exercising.  REVIEW OF SYSTEMS:  Positive for occasional sweats over the past weekend.  He has had  chest pain, dyspnea as outlined in the HPI.  He has chronic low level neck pain.  Over the past few weeks, he has noted left lower quadrant abdominal discomfort as well as small volume bowel movements.  He has some concern that this may be related to diverticular flare.  Otherwise all systems reviewed are negative.  He is a full code.  PHYSICAL EXAMINATION:  VITAL SIGNS:  He is afebrile, heart rate 64, respirations 16, blood pressure 124/79, pulse ox 97% on 2 liters. GENERAL:  Pleasant white male in no acute distress.  Awake, alert and oriented x3.  He has a normal affect. HEENT:  Normal.  Nares grossly intact nonfocal. SKIN:  Warm, dry without lesions or masses. NECK:  Supple without bruits or JVD. LUNGS:  Respirations are regular and unlabored.  Clear to auscultation. CARDIAC:  Regular S1-S2.  No S3, S4 or murmurs. ABDOMEN:  Round, soft with diffuse left upper and lower quadrant tenderness with deep palpation.  Bowel sounds present x4 though hypoactive. EXTREMITIES:  Warm, dry, pink.  No clubbing, cyanosis or edema. Dorsalis pedis and posterior tibial pulses are 2+ and equal bilaterally. Bilateral radial pulses are 2+ with normal Allen test.  DIAGNOSTICS AND IMAGING:  Chest x-ray is pending.  EKG shows sinus rhythm rate of 76, normal axis, no acute ST-T changes.  Hemoglobin 14.5, hematocrit 42.4, WBC 8.6, platelets 256.  Sodium 140, potassium 3.9, chloride 107, CO2 25, BUN 19, creatinine 0.96, glucose 128, CK 227, MB 26.8, troponin-I 1.24.  ASSESSMENT AND PLAN: 1. Non-ST-elevation myocardial infarction/coronary artery disease.     The patient has had increased frequency of rest, exertion and now     nocturnal chest pain over the past week, relieved with aspirin and     nitroglycerin.  He is currently pain-free.  ECG is nonacute and     cardiac markers are elevated.  Plan is for cath today.  Continue     aspirin, beta blocker, heparin as well as nitro paste.  The patient      reports statin intolerance saying that he has tried several in the     past.  He is going to try Zetia.  Plan eventual cardiac rehab. 2. Hypertension, stable. 3. Hyperlipidemia, statin intolerance above.  We will try Zetia. 4. History of diverticulitis.  The patient noted diffuse/vague left     abdominal discomfort with small volume stools     over the past  few weeks.  He has not had a stool in the past day.     His white count is normal and he is afebrile.  The patient is     concerned that his symptoms are similar to a prior diverticular     flare and issues with adhesions and obstruction.  We will check     abdominal ultrasound about obstruction.     Nicolasa Ducking, ANP   ______________________________ Cassell Clement, M.D.    CB/MEDQ  D:  06/17/2010  T:  06/18/2010  Job:  [id number]  Electronically Signed by Nicolasa Ducking ANP on 06/28/2010 04:04:06 PM Electronically Signed by Cassell Clement M.D. on 07/01/2010 01:33:26 PM

## 2010-07-07 NOTE — Discharge Summary (Signed)
NAME:  [name], [name]                 ACCOUNT NO.:  [id number]  MEDICAL RECORD NO.:  [id number]           PATIENT TYPE:  I  LOCATION:  2029                         FACILITY:  [facility]  PHYSICIAN:  [name], M.D. DATE OF BIRTH:  10/05/1955  DATE OF ADMISSION:  06/17/2010 DATE OF DISCHARGE:  06/25/2010                              DISCHARGE SUMMARY   PRIMARY ADMITTING DIAGNOSIS:  Chest pain.  ADDITIONAL/DISCHARGE DIAGNOSES: 1. Severe 3-vessel coronary artery disease. 2. Acute non-ST segment elevation myocardial infarction. 3. Prior history of coronary artery disease status post myocardial     infarction in 2004 and 2006. 4. Hyperlipidemia. 5. Hypertension. 6. Hyperglycemia with preop hemoglobin A1c 6.1. 7. Postoperative mild pericarditis, resolving.  PROCEDURES PERFORMED: 1. Cardiac catheterization. 2. Coronary artery bypass grafting x4 (left internal mammary artery to     the distal LAD, saphenous vein graft to the ramus intermedius,     saphenous vein graft to the obtuse marginal, saphenous vein graft     to the left posterolateral). 3. Endoscopic vein harvest, right leg.  HISTORY:  The patient is a 55 year old male with a known history of coronary artery disease.  He had previously been treated in [place] where he resided until recently.  He had acute myocardial infarction in 2004 which was treated medically.  He also had a second MI in 2006 and at that time underwent PCI and stenting of a single vessel of which the report is not available.  He had been in his usual state of health until approximately 1 week ago when he began to develop exertional chest pain and worsening fatigue.  Symptoms accelerated over the course of the week until he awoke from his sleep on the night prior to admission with severe substernal chest discomfort.  He took aspirin and nitroglycerin and the chest pain did resolve.  He subsequently presented to the emergency department at [facility] and was noted to have abnormal cardiac enzymes.  He was then transferred to [facility] for further evaluation and treatment.  HOSPITAL COURSE:  [name] was transferred from [facility] to [facility] on June 17, 2010 and was admitted by [facility].  He was ruled in for non-STEMI and was started on aspirin, a beta-blocker, a heparin drip, and nitro paste.  Pain resolved and he was able to be taken to the cardiac cath lab later in the day on April 17 by Dr. [place].  Cardiac catheterization revealed severe 3-vessel coronary artery disease with mild left ventricular dysfunction.  Ejection fraction was estimated at 50%.  His coronary anatomy was not felt to be amenable to percutaneous intervention.  A cardiac surgery consult was obtained and the patient was seen by Dr. Cornelius [name] and his films were reviewed.  Dr. Cornelius [name] agreed with the need for surgical revascularization.  He explained all risks, benefits, and alternatives of surgery to the patient and he agreed to proceed.  He remained stable prior to surgery and had no further episodes of chest pain.  He was taken to the operating room on June 20, 2010 where he underwent CABG x4 by Dr.  Cornelius [name].  Please see previously dictated operative report for complete details of surgery.  He tolerated the procedure well and transferred to the SICU in stable condition.  He was able to be extubated shortly after surgery.  He was hemodynamically stable and doing well on postop day #1.  At that time, his chest tubes and hemodynamic monitoring lines were removed.  He did remain in the unit for an additional 24 hours of observation.  At that point he was able to be transferred to the step-down unit.  Overall his postoperative course has been uneventful.  He has had some hypertension and was started back on his home dose of Diovan and his beta-blocker dosage was titrated upward.  Presently his blood pressures have been fairly well- controlled  on his current medication regimen.  He also has had postoperative pericarditis with some mild elevation of his white blood cell count and a pericardial friction rub on physical exam.  He was treated with ibuprofen and at the present time, his rub has resolved and he is feeling well.  He has also been volume overloaded postoperatively and has been treated with a short course of Lasix to which he has responded well.  He presently is at his preoperative weight with minimal lower extremity edema on physical exam.  He has otherwise remained afebrile and in sinus rhythm.  His incisions are all healing well.  He is ambulating in the halls with cardiac rehab phase I and is making good progress with mobility.  His latest labs on postop day #5 show a sodium 141, potassium 4.1, BUN 17, creatinine 0.93, white blood cell count 13, hemoglobin 12.7, hematocrit 37.6, platelets 242,000.  His most recent chest x-ray showed bibasilar atelectasis and small left pleural effusion.  He is overall progressing well and at this time is deemed ready for discharge home.  DISCHARGE MEDICATIONS: 1. Enteric-coated aspirin 325 mg daily. 2. Lopressor 50 mg b.i.d. 3. Oxycodone IR 5-10 mg q.4-6 h. p.r.n. for pain. 4. Vytorin 10/10, 1 tablet daily. 5. Tylenol 325 mg 1-2 q.6 h. p.r.n. 6. Aleve 220 mg 2 tablets daily p.r.n. for pain. 7. Diovan 80 mg daily. 8. Probiotic over-the-counter daily.  DISCHARGE INSTRUCTIONS:  He is asked to refrain from driving, heavy lifting, or strenuous activity.  He may continue ambulating daily and using his incentive spirometer.  He may shower daily and clean his incisions with soap and water.  He will continue a low-fat, low-sodium diet.  DISCHARGE FOLLOWUP:  The patient would need to see his primary care physician in the next several weeks regarding his perioperative hyperglycemia and his slightly elevated hemoglobin A1c.  He will also need to follow up with [facility] in [place]  in 2 weeks.  He will see Dr. Cornelius [name] in 3 weeks with a chest x-ray.  He will call our office if he experiences any problems or has questions in the interim.     Coral Ceo, P.A.   ______________________________ [name], M.D.   GC/MEDQ  D:  06/25/2010  T:  06/25/2010  Job:  [id number]  cc:   Cassell Clement, M.D. [facility] Office  Electronically Signed by Coral Ceo P.A. on 07/04/2010 02:23:57 PM Electronically Signed by Tressie Stalker M.D. on 07/07/2010 08:39:17 AM

## 2010-07-08 ENCOUNTER — Encounter: Payer: Self-pay | Admitting: Cardiology

## 2010-07-08 ENCOUNTER — Ambulatory Visit (INDEPENDENT_AMBULATORY_CARE_PROVIDER_SITE_OTHER): Payer: Managed Care, Other (non HMO) | Admitting: Cardiology

## 2010-07-08 ENCOUNTER — Encounter: Payer: Self-pay | Admitting: *Deleted

## 2010-07-08 VITALS — BP 103/72 | HR 80 | Ht 70.0 in | Wt 239.0 lb

## 2010-07-08 DIAGNOSIS — I10 Essential (primary) hypertension: Secondary | ICD-10-CM

## 2010-07-08 DIAGNOSIS — E785 Hyperlipidemia, unspecified: Secondary | ICD-10-CM | POA: Insufficient documentation

## 2010-07-08 DIAGNOSIS — I252 Old myocardial infarction: Secondary | ICD-10-CM | POA: Insufficient documentation

## 2010-07-08 MED ORDER — METOPROLOL TARTRATE 50 MG PO TABS: ORAL_TABLET | ORAL | Status: DC

## 2010-07-08 MED ORDER — LOSARTAN POTASSIUM 50 MG PO TABS: 25.0000 mg | ORAL_TABLET | Freq: Every day | ORAL | Status: DC

## 2010-07-08 NOTE — Assessment & Plan Note (Signed)
His LDL in the hospital was only 109. His HDL was 35. I would like to have a goal LDL less than 70 and HDL greater than 50. He has a prescription for Vytorin 10/10 and he will start this. He can get his lipids checked again in about 2 months.

## 2010-07-08 NOTE — Assessment & Plan Note (Signed)
As above his blood pressure is low and I will reduce his Cozaar.

## 2010-07-08 NOTE — Patient Instructions (Addendum)
   Decrease Cozaar to 25mg  daily   Decrease Metoprolol Tartrate to 25mg  twice a day    Follow up in 2 months - see appointment for above.

## 2010-07-08 NOTE — Assessment & Plan Note (Signed)
The patient has coronary disease as described. At present he has none of his previous anginal symptoms. He needs aggressive risk reduction and we spent quite a bit of time discussing this. He is going to enroll in cardiac rehabilitation. For now I will reduce his Cozaar as his blood pressure is running low. I will also make sure he is taking his metoprolol in divided doses as his immediate release. He has a followup appointment with Dr. Cornelius Moras.  Of note he needs to find a new primary physician. He did have borderline hyperglycemia in the hospital and this needs to be followed.

## 2010-07-08 NOTE — Progress Notes (Signed)
HPI The patient presents for follow after bypass surgery. He had four-vessel CABG for 3 vessel coronary disease. He is making a slow but steady recovery from his bypass although several days ago he reports he had a setback after he did too much walking. He had one episode of substernal chest discomfort followed by fatigue. However, this is now resolved. Currently he is denying any chest discomfort, neck or arm discomfort. He is not having any new shortness of breath, PND or orthopnea. He has had no palpitations, presyncope or syncope. He has lost some weight 17 pounds. He said he did not tolerate Crestor as he had memory problems. He has yet to start Vytorin. He also reports that he has been somewhat sluggish and fatigue with a low blood pressure.  Allergies  Allergen Reactions  . Flagyl (Metronidazole Hcl) Hives  . Statins     Fatigue and myalgias as well as memory loss    Current Outpatient Prescriptions  Medication Sig Dispense Refill  . acetaminophen (TYLENOL) 325 MG tablet Take 650 mg by mouth every 6 (six) hours as needed.        Marland Kitchen aspirin 325 MG EC tablet Take 325 mg by mouth daily.        . bisacodyl (DULCOLAX) 5 MG EC tablet Take 5 mg by mouth daily as needed.        . docusate sodium (COLACE) 100 MG capsule Take 100 mg by mouth daily as needed.        Marland Kitchen losartan (COZAAR) 50 MG tablet Take 50 mg by mouth daily.        . metoprolol (LOPRESSOR) 50 MG tablet Take 50 mg by mouth daily.       . naproxen sodium (ANAPROX) 220 MG tablet Take 440 mg by mouth daily.        Marland Kitchen oxyCODONE (OXY IR/ROXICODONE) 5 MG immediate release tablet Take 5 mg by mouth every 4 (four) hours as needed.        . ezetimibe-simvastatin (VYTORIN) 10-10 MG per tablet Take 1 tablet by mouth at bedtime.        Marland Kitchen DISCONTD: Probiotic Product (PROBIOTIC FORMULA PO) Take 1 tablet by mouth daily.        Marland Kitchen DISCONTD: valsartan (DIOVAN) 80 MG tablet Take 80 mg by mouth daily.          Past Medical History  Diagnosis Date  .  Old myocardial infarction   . Unspecified essential hypertension   . Chest pain, unspecified   . Encounter for long-term (current) use of other medications   . Postsurgical percutaneous transluminal coronary angioplasty status   . Hyperlipidemia   . Statin intolerance     history of  . Diverticulitis     history of adhesions  . Osteoarthritis   . Degenerative disk disease   . Sleep apnea   . Restless legs syndrome     Past Surgical History  Procedure Date  . Post fusion of c6-c7   . Colon resection     with colostomy and subsequent reversal  . Hernia repair     incisional  . Vasectomy   . Coronary artery bypass graft   06/20/2010      [name], M.D. (LIMA LAD, SVG RI, SVG OM, SVG PL)  . Pci and stenting of one vessel  2006  . Tonsillectomy and uvulectomy.     Sleep apnea  . Ulnar nerve surgery     Right  . Shoulder surgery  Left    ROS:  As stated in the HPI and negative for all other systems.  PHYSICAL EXAM BP 103/72  Pulse 80  Ht 5\' 10"  (1.778 m)  Wt 239 lb (108.41 kg)  BMI 34.29 kg/m2 GENERAL:  Well appearing HEENT:  Pupils equal round and reactive, fundi not visualized, oral mucosa unremarkable NECK:  No jugular venous distention, waveform within normal limits, carotid upstroke brisk and symmetric, no bruits, no thyromegaly LYMPHATICS:  No cervical, inguinal adenopathy LUNGS:  Slight left pleuritic rub BACK:  No CVA tenderness CHEST:  Well healed sternotomy scar. HEART:  PMI not displaced or sustained,S1 and S2 within normal limits, no S3, no S4, no clicks, no rubs, no murmurs ABD:  Flat, positive bowel sounds normal in frequency in pitch, no bruits, no rebound, no guarding, no midline pulsatile mass, no hepatomegaly, no splenomegaly,  Recent surgical scars healing with tertiary intention EXT:  2 plus pulses throughout, no edema, no cyanosis no clubbing SKIN:  No rashes no nodules NEURO:  Cranial nerves II through XII grossly intact, motor grossly  intact throughout PSYCH:  Cognitively intact, oriented to person place and time   EKG:  Sinus rhythm, rate 77, right bundle branch block, old inferior infarct, right bundle branch block appears to be new compared with the previous.  ASSESSMENT AND PLAN

## 2010-07-10 NOTE — Telephone Encounter (Signed)
Prior authorization for vytorin 10/10mg  was approved and information faxed to [facility].

## 2010-07-11 ENCOUNTER — Other Ambulatory Visit: Payer: Self-pay | Admitting: Thoracic Surgery (Cardiothoracic Vascular Surgery)

## 2010-07-11 DIAGNOSIS — I2510 Atherosclerotic heart disease of native coronary artery without angina pectoris: Secondary | ICD-10-CM

## 2010-07-14 ENCOUNTER — Ambulatory Visit
Admission: RE | Admit: 2010-07-14 | Discharge: 2010-07-14 | Disposition: A | Discharge status: Home / Self Care | Payer: Managed Care, Other (non HMO) | Source: Ambulatory Visit | Attending: Thoracic Surgery (Cardiothoracic Vascular Surgery) | Admitting: Thoracic Surgery (Cardiothoracic Vascular Surgery)

## 2010-07-14 ENCOUNTER — Encounter (INDEPENDENT_AMBULATORY_CARE_PROVIDER_SITE_OTHER): Payer: Self-pay | Admitting: Thoracic Surgery (Cardiothoracic Vascular Surgery)

## 2010-07-14 DIAGNOSIS — I2510 Atherosclerotic heart disease of native coronary artery without angina pectoris: Secondary | ICD-10-CM

## 2010-07-14 NOTE — Assessment & Plan Note (Signed)
OFFICE VISIT  [name] [name] DOB:  1955/05/01                                        Jul 14, 2010 CHART #:  [id number]  HISTORY OF PRESENT ILLNESS:  The patient returns for routine follow-up status post coronary artery bypass grafting x4 on June 20, 2010.  His postoperative recovery has been uncomplicated.  Since hospital discharge, he has been seen in follow-up on one occasion at the [facility] office in [place].  Overall, he is progressing quite well.  He plans to start the cardiac rehab program in the near future.  He states that he has mild residual soreness and he is no longer requiring any sort of oral narcotic pain relievers.  His appetite is good.  He is sleeping well at night.  He has not had any shortness of breath.  His physical endurance has gradually improved, although he still gets tired and has to pace himself.  Overall, he has had no problems or complications.  His dose of beta-blocker was cut in half because his blood pressure was running somewhat on the low side.  Overall, he feels well.  PHYSICAL EXAMINATION:  General:  Well-appearing male.  Vital Signs: Blood pressure 128/79, pulse 86, oxygen saturation 97% on room air. Chest:  A median sternotomy incision that is healing nicely.  The sternum is stable on palpation.  Breath sounds are clear to auscultation and symmetrical bilaterally.  No wheezes, rales or rhonchi noted. Cardiovascular: Notable for regular rate and rhythm.  No murmurs, rubs or gallops are appreciated.  Abdomen:  Soft, nontender.  Extremities: Warm and well-perfused.  There is no lower extremity edema.  The right lower extremity incision from endoscopic vein harvest has healed completely.  The remainder of his physical exam is unremarkable.  DIAGNOSTIC TEST:  Chest x-ray performed today at the [facility] is reviewed.  This demonstrates clear lung fields bilaterally. There are no pleural effusions.   All the sternal wires appear intact.  IMPRESSION:  Excellent progress following recent coronary artery bypass grafting.  PLAN:  I have encouraged the patient to continue to gradually increase his physical activity over the next couple of months.  I think he can resume driving an automobile.  I have reminded him to avoid any sort of heavy lifting or strenuous, using his arms or shoulders for at least another 2 months.  We have discussed a plan for him going back to work. 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 It will be a full 2 months before he can get back to unrestricted activity.  All of his questions have been addressed.  He has been reminded to continue work on his long-term diabetes management and to keep close eye on his cholesterol management so forth.  He will continue followup with the [facility] office in [place].  In the future, he will call or return to see [place] as needed.  [name] [name]. [name] [name], M.D. Electronically Signed  CHO/MEDQ  D:  07/14/2010  T:  07/14/2010  Job:  [id number]  cc:   [name] [name], M.D. [name] [name], M.D. [name] Codding, MD,FACC

## 2010-07-25 NOTE — Op Note (Signed)
NAME:  Kayes, Elmore                 ACCOUNT NO.:  [id number]  MEDICAL RECORD NO.:  [id number]          PATIENT TYPE:  LOCATION:                                 FACILITY:  PHYSICIAN:  Burna Forts, M.D.DATE OF BIRTH:  10/24/1955  DATE OF PROCEDURE:  06/20/2010 DATE OF DISCHARGE:                              OPERATIVE REPORT   INTRAOPERATIVE TRANSESOPHAGEAL ECHOCARDIOGRAPHIC REPORT  INDICATIONS FOR PROCEDURE:  Mr. Hurrell is a 55 year old gentleman, a patient of Dr. Cassell Clement, who presents today for coronary artery bypass grafting to be performed by Dr. Tressie Stalker and requested that we place a TEE probe for evaluation of cardiac structures and function.  The patient was brought to the holding area on the morning of surgery where under local anesthesia and sedation, pulmonary artery lines and arterial lines were placed.  After the induction of general anesthesia, the TEE probe was prepared and then inserted into the mouth via the esophagus to the stomach, then slightly withdrawn for imaging the cardiac structures.  Precardiopulmonary bypass TEE examination: Left ventricle.  The left ventricular chamber is seen initially in the short axis view.  There is concentric left ventricular hypertrophy appreciated.  There are large to enlarged papillary muscles and a fairly large overall left ventricular chamber.  There is good contractility appreciated in the short-axis view.  Long-axis view again shows overall good contractility with only the slightest suggestion of some hypocontractility noted in the anterior wall.  Overall, there is good segmental wall thickening and all segmental wall areas were appreciated both inferiorly, laterally, anteriorly, and septally.  There are no masses noted within.  Mitral valve.  This is a normal mitral valve structure in appearance and function.  This is thin, compliant, mobile anterior and posterior leaflets.  They appear to coapt  appropriately, and there is only trace mitral regurgitant flow appreciated on color Doppler.  Left atrium.  Normal left atrial chamber.  No masses are appreciated within and the interatrial septum was interrogated and is intact.  Right atrium.  Normal right atrial chamber.  Pulmonary artery catheter appreciated.  Aortic valve.  Seen initially in the short-axis view.  They are three normal-appearing cusps.  The overall function of the valve is normal. There is no obstruction to flow.  There is appropriate opening and closing during periods of systole and diastole.  This is totally a normal aortic valve.  Right ventricle.  Right ventricle was seen in a four-chamber view. There is good overall contractility appreciated.  There is a moderator band within the chamber and this is a normal variant.  The patient was placed on cardiopulmonary bypass, coronary artery bypass grafting is carried out and then separated from cardiopulmonary bypass with initial attempt.  Post-cardiopulmonary bypass TEE examination (limited exam):  Left ventricle.  Left ventricular chamber was seen early in the post bypass period.  There is good to excellent contractility appreciated in the views, both in short-axis and long-axis views.  The area of the anterior wall was again interrogated closely.  There is good overall contractility appreciated here.  Multiple views were obtained in the long-axis and short-axis  views again showing satisfactory contractile pattern in the post-bypass period.  The rest of the cardiac structures were as previously described without any significant changes from the prebypass period and the patient was returned in stable condition to the cardiac intensive care unit.          ______________________________ Burna Forts, M.D.     JTM/MEDQ  D:  06/20/2010  T:  06/21/2010  Job:  [id number]  Electronically Signed by Ester Rink M.D. on 07/25/2010 09:25:32 AM

## 2010-08-13 ENCOUNTER — Other Ambulatory Visit: Payer: Self-pay | Admitting: *Deleted

## 2010-08-13 DIAGNOSIS — I10 Essential (primary) hypertension: Secondary | ICD-10-CM

## 2010-08-13 MED ORDER — LOSARTAN POTASSIUM 50 MG PO TABS: 25.0000 mg | ORAL_TABLET | Freq: Every day | ORAL | Status: DC

## 2010-08-15 ENCOUNTER — Telehealth: Payer: Self-pay | Admitting: *Deleted

## 2010-08-15 NOTE — Telephone Encounter (Signed)
Pt walked into the office on 6/13 stating he was having pain in his chest from pulling a muscle.  Pt states today that he is having a lot of pain in his chest after picking up a grocery back that weighed about 16 lbs last week. He states he was about 7-8 weeks after CABG at the time. He has a knot (about the size of his palm) just to the (L) of the sternum. He states it is prohibiting the use of his (L) arm. He states yesterday he "gave in" and took 1/2 of the oxycodone that he was prescribed after CABG and was finally able to sleep. He states he can deal with just a pulled muscle but wants to make sure that isn't anything else going on.

## 2010-08-15 NOTE — Telephone Encounter (Signed)
Pt notified and verbalized understanding.

## 2010-08-15 NOTE — Telephone Encounter (Signed)
Left message to call back on voice mail

## 2010-08-15 NOTE — Telephone Encounter (Signed)
See Telephone encounter.

## 2010-08-15 NOTE — Telephone Encounter (Signed)
With recent CABG, and musculoskeletal sxs, suggest he f/u with Surgeon, if sxs persist or worsen.

## 2010-08-18 ENCOUNTER — Ambulatory Visit
Admission: RE | Admit: 2010-08-18 | Discharge: 2010-08-18 | Disposition: A | Discharge status: Home / Self Care | Payer: Managed Care, Other (non HMO) | Source: Ambulatory Visit | Attending: Thoracic Surgery (Cardiothoracic Vascular Surgery) | Admitting: Thoracic Surgery (Cardiothoracic Vascular Surgery)

## 2010-08-18 ENCOUNTER — Other Ambulatory Visit: Payer: Self-pay | Admitting: Thoracic Surgery (Cardiothoracic Vascular Surgery)

## 2010-08-18 ENCOUNTER — Ambulatory Visit (INDEPENDENT_AMBULATORY_CARE_PROVIDER_SITE_OTHER): Payer: Self-pay | Admitting: Thoracic Surgery (Cardiothoracic Vascular Surgery)

## 2010-08-18 DIAGNOSIS — R222 Localized swelling, mass and lump, trunk: Secondary | ICD-10-CM

## 2010-08-18 DIAGNOSIS — R509 Fever, unspecified: Secondary | ICD-10-CM

## 2010-08-18 DIAGNOSIS — I2510 Atherosclerotic heart disease of native coronary artery without angina pectoris: Secondary | ICD-10-CM

## 2010-08-19 NOTE — Assessment & Plan Note (Signed)
OFFICE VISIT  [name], [name] DOB:  [birth year]                                        August 18, 2010 CHART #:  [id number]  HISTORY OF PRESENT ILLNESS:  The patient returns to the office as an unscheduled visit having undergone coronary artery bypass grafting on June 20, 2010.  He was last seen here in the office on Jul 14, 2010, at which time he is doing quite well.  He reports that approximately 2 weeks ago, he had been lifting some groceries and after this time, he started developed pain and swelling on his left anterior chest wall. This has continued to progress over the last 2 weeks and over the last few days, he has had considerably increased pain to the point where now he can barely use his left arm for any sort of physical activity.  He reports a low-grade fever with temperature around 100 degrees.  He has not had any cough.  He denies any pain in the middle of the chest.  He has not had any shortness of breath.  He otherwise feels quite well.  He denies any sensation of clicking or motion at the sternum of chest wall. He cannot recall any other significant injuries or physical activity that are bothering him.  The remainder of his review of systems is unremarkable.  PHYSICAL EXAMINATION:  A well-appearing male with blood pressure 110/76, pulse 96 regular, oxygen saturation 95% on room air, temperature 100.8 degrees Fahrenheit.  On examination of the anterior chest, the median sternotomy scar is healing nicely.  The sternum is completely stable and nontender.  However, there is swelling and tenderness across the left anterior superior chest wall.  There is mild erythema of the skin overlying this area that is tender on palpation.  There is no crepitance.  There is no skin rash per se.  There is no sign of sternal or chest wall instability.  Breath sounds are clear to auscultation. Cardiovascular exam is notable for regular rate and rhythm.  No  murmurs, rubs, or gallops are noted.  The abdomen is soft and nontender.  Bowel sounds are present.  The extremities are warm and well perfused.  There is no lower extremity edema.  DIAGNOSTIC TEST:  Chest x-ray performed today at the [facility] is reviewed.  This demonstrates clear lung fields bilaterally. There are no pleural effusions.  All the sternal wires appear intact with no sign of sternal dehiscence or fracture.  IMPRESSION:  Swelling and tenderness on the left anterior chest wall now 2 months following coronary artery bypass surgery.  The patient has low- grade temperature and this could represent an unusual presentation for some type of sternal wound infection.  However, the picture is quite unusual.  It is also possible that he could have dislocation of cartilages on the left side that may or may not have been related to previous left internal mammary artery harvest and grafting.  The possibility of deep sternal wound infection must be entertained. Overall, I am concerned that there is something acute that has developed.  Having said this, the patient looks quite good, otherwise and  does not appear toxic or ill.  PLAN:  I have offered to admit the patient to the hospital today for chest CT scan and further diagnostic workup.  He does not wish to come into  the hospital unless it is absolutely necessary.  As an alternative, we will send him for blood work today to consist of two sets of blood cultures with basic metabolic panel and complete blood count.  We will also send him for a CT scan of the chest to evaluate the anterior chest wall swelling and rule out sternal dehiscence and/or dislocation of the costal cartilages as well as look for the possibility of signs of deep sternal wound infection.  We will plan to see the patient back in 2 days for followup.  He will call and present to the emergency room between now and then if he develops fever of 101  degrees or higher or any other significant change in his symptomatology.  All of his questions have been addressed.  [name], M.D. Electronically Signed  CHO/MEDQ  D:  08/18/2010  T:  08/19/2010  Job:  [id number]

## 2010-08-20 ENCOUNTER — Encounter (INDEPENDENT_AMBULATORY_CARE_PROVIDER_SITE_OTHER): Payer: Self-pay | Admitting: Thoracic Surgery (Cardiothoracic Vascular Surgery)

## 2010-08-20 ENCOUNTER — Telehealth: Payer: Self-pay | Admitting: Cardiology

## 2010-08-20 DIAGNOSIS — I2510 Atherosclerotic heart disease of native coronary artery without angina pectoris: Secondary | ICD-10-CM

## 2010-08-20 NOTE — Telephone Encounter (Signed)
Refill losartin 50 mg uses [facility] on [address]

## 2010-08-21 NOTE — Assessment & Plan Note (Signed)
OFFICE VISIT  Mode, [name] DOB:  1955-12-22                                        August 20, 2010 CHART #:  [id number]  The patient returns for followup of his left anterior chest wall tenderness.  He was seen in the office on Monday, August 18, 2010.  He underwent chest CT scan that day which revealed diffuse swelling and enlargement of the left pectoralis major and pectoralis minor muscles. No other abnormalities were noted whatsoever and in particular, the mediastinum and contents of the chest were all completely normal.  The sternum appears completely intact with no sign of sternal wound dehiscence nor infection.  There was no sign of any fluid collection in the pectoralis major muscles, but there was diffuse swelling enlargement with soft tissue stranding consistent with possible acute myositis.  No other abnormalities were noted.  Blood work obtained at that time was notable for a white blood count that was slightly elevated at 12,000. All of the blood work was normal.  Blood cultures remained no gross today so far.  The patient was started on oral Keflex yesterday to treat possible acute myositis.  He states that over the last 24 hours, he has seen some improvement.  He still has swelling and tenderness of the chest, but he knows that it seems to be getting a little bit better.  He has not had any fever in the last 24 hours.  He otherwise feels well and reports no new problems or complaints.  PHYSICAL EXAMINATION:  A well-appearing male with blood pressure 104/74, temperature 97.8 degrees.  Examination of the chest, notable for persistent swelling of the left pectoralis major muscle, but this is notably slightly decreased in comparison with Monday with decreased surrounding erythema.  There was still some tenderness on exam, but is no longer nearly as tender nor swollen as it was 2 days ago.  No new abnormalities are noted.  IMPRESSION:  Possible  acute myositis involving the left pectoralis major and pectoralis minor muscles.  This is somewhat peculiar and of unclear etiology.  Presumably, it could be bacterial, but also viral illness can do something like this.  Finally, the possibility could represent some sort of occult complications related to his original surgery, although based upon CT scan this seems less likely.  PLAN:  We will continue oral Keflex for now.  The patient has been advised to avoid any sort of strenuous use of his arms or shoulders.  He will continue to ice down his anterior chest wall twice a day with cold compresses.  I have also suggested that he should try nonsteroidal antiinflammatory medicine such as ibuprofen or Naprosyn.  We will see him back next Monday for followup.  Salvatore Decent. [name], M.D. Electronically Signed  CHO/MEDQ  D:  08/20/2010  T:  08/21/2010  Job:  [id number]

## 2010-08-22 ENCOUNTER — Other Ambulatory Visit: Payer: Self-pay | Admitting: *Deleted

## 2010-08-22 DIAGNOSIS — I10 Essential (primary) hypertension: Secondary | ICD-10-CM

## 2010-08-22 NOTE — Telephone Encounter (Signed)
Called in a refill on this medication seems as if last refill was called in as NOT Printed Im assuming the refill was faxed in but the pharmacist states that there is no faxed copy of this prescription pt was upset at the fact he did not get refills so i called in to the pharmacy with 12 refills of losartan

## 2010-08-25 ENCOUNTER — Ambulatory Visit (INDEPENDENT_AMBULATORY_CARE_PROVIDER_SITE_OTHER): Payer: Self-pay | Admitting: Thoracic Surgery (Cardiothoracic Vascular Surgery)

## 2010-08-25 ENCOUNTER — Encounter: Payer: Self-pay | Admitting: Thoracic Surgery (Cardiothoracic Vascular Surgery)

## 2010-08-25 VITALS — BP 114/80 | HR 74 | Temp 97.0°F | Resp 18

## 2010-08-25 DIAGNOSIS — R071 Chest pain on breathing: Secondary | ICD-10-CM

## 2010-08-25 DIAGNOSIS — M609 Myositis, unspecified: Secondary | ICD-10-CM

## 2010-08-25 DIAGNOSIS — I2510 Atherosclerotic heart disease of native coronary artery without angina pectoris: Secondary | ICD-10-CM

## 2010-08-26 NOTE — Assessment & Plan Note (Signed)
OFFICE VISIT  Kai, Salahuddin DOB:  02-Aug-1955                                        August 25, 2010 CHART #:  [id number]  The patient returns for further follow up of his left anterior chest wall tenderness.  He was just seen here in the office last week on June 20.  Since then he has continued to improve.  The soreness has abated, now he states that his chest just feels more or less numb.  He is still having some low-grade fevers, but overall this is much improved.  He otherwise feels well.  No new problems have developed.  PHYSICAL EXAMINATION:  GENERAL:  Notable for a well-appearing male with temperature 98 degrees Fahrenheit.  CHEST:  Further decrease swelling in comparison with his last exam.  There is no erythema or cellulitis whatsoever.  The sternum remains stable and nontender.  No abnormalities are noted.  DIAGNOSTIC TESTS:  Final results of blood cultures obtained on June 18 were no growth.  Monospot test was negative.  IMPRESSION:  The patient appears to be recovering uneventfully with what presumably is acute myositis involving the left pectoralis major muscle and pectoralis minor muscle.  This seems to be resolving uneventfully. He remains on oral Keflex.  PLAN:  We will have the patient return in 3 weeks' time.  He will continue to avoid any heavy lifting or strenuous use of his arms or shoulders.  He will remain on Keflex.  We will see him back in 3 weeks and see how he is getting along.  At some point, a followup CT scan may be reasonable but as long as he is getting better, I see no reason to do that at this point.  [name], M.D. Electronically Signed  CHO/MEDQ  D:  08/25/2010  T:  08/26/2010  Job:  [id number]

## 2010-09-08 ENCOUNTER — Ambulatory Visit: Payer: Managed Care, Other (non HMO) | Admitting: Cardiology

## 2010-09-15 ENCOUNTER — Ambulatory Visit (INDEPENDENT_AMBULATORY_CARE_PROVIDER_SITE_OTHER): Payer: Self-pay | Admitting: Thoracic Surgery (Cardiothoracic Vascular Surgery)

## 2010-09-15 ENCOUNTER — Encounter: Payer: Self-pay | Admitting: Thoracic Surgery (Cardiothoracic Vascular Surgery)

## 2010-09-15 DIAGNOSIS — Z951 Presence of aortocoronary bypass graft: Secondary | ICD-10-CM | POA: Insufficient documentation

## 2010-09-15 DIAGNOSIS — M609 Myositis, unspecified: Secondary | ICD-10-CM | POA: Insufficient documentation

## 2010-09-15 DIAGNOSIS — IMO0001 Reserved for inherently not codable concepts without codable children: Secondary | ICD-10-CM

## 2010-09-15 NOTE — Progress Notes (Signed)
[name] returns for followup and wound check having recently been treated for acute myositis involving the left pectoralis major muscle. He completed his course of oral antibiotics approximately 3 weeks ago. He reports that the swelling and tenderness in his chest has continued to resolve and he now feels quite well. On exam he looks traumatically better with mild residual soreness on palpation over his left pectoralis muscle. There is no surrounding cellulitis. The st scar has healed nicely in the sternum is stable on palpation. I think he can return to work. I have cautioned him to avoid any heavy lifting or strenuous use of his arms or shoulders for another 2-3 months. He will call and return to see [place] in the future as needed.

## 2010-09-15 NOTE — Patient Instructions (Signed)
Avoid any strenuous use of the arms or shoulders or heavy lifting for 3 months.

## 2010-09-29 ENCOUNTER — Other Ambulatory Visit: Payer: Self-pay | Admitting: Cardiology

## 2010-09-30 ENCOUNTER — Other Ambulatory Visit: Payer: Self-pay | Admitting: *Deleted

## 2010-09-30 MED ORDER — EZETIMIBE-SIMVASTATIN 10-10 MG PO TABS: 1.0000 | ORAL_TABLET | Freq: Every day | ORAL | Status: DC

## 2010-10-24 LAB — CBC WITH DIFFERENTIAL/PLATELET
PSA: 0.41 ng/mL
WBC: 7.5

## 2010-10-24 LAB — ANEMIA PANEL
HCT: 46 %
Hemoglobin: 14.9 g/dL (ref 13.5–17.5)

## 2010-10-31 NOTE — Progress Notes (Signed)
Created prior to [employer] at [facility].  Closing encounter. [name]

## 2010-11-05 ENCOUNTER — Ambulatory Visit (INDEPENDENT_AMBULATORY_CARE_PROVIDER_SITE_OTHER): Payer: Managed Care, Other (non HMO) | Admitting: Urgent Care

## 2010-11-05 ENCOUNTER — Encounter: Payer: Self-pay | Admitting: Urgent Care

## 2010-11-05 VITALS — BP 120/77 | HR 74 | Temp 97.3°F | Ht 70.0 in | Wt 248.4 lb

## 2010-11-05 DIAGNOSIS — R195 Other fecal abnormalities: Secondary | ICD-10-CM

## 2010-11-05 NOTE — Patient Instructions (Signed)
I will call w/ your instructions after speaking w/ Dr Jena Gauss If you don't hear from me by Friday, please call me @ [phone] Please bring or fax your copies of last colonoscopy & operative report if you have from [facility]

## 2010-11-05 NOTE — Progress Notes (Signed)
Referring Provider: Dr. Phillips Odor Primary Care Physician:  Colette Ribas, MD Primary Gastroenterologist:  Dr. Jena Gauss  Chief Complaint  Patient presents with  . Rectal Bleeding    very weak/light head    HPI:  [name] is a 55 y.o. male here as a referral from Dr. Phillips Odor for hemoccult positive stool.  He gives history of colonoscopy one and a half years ago in [place] prior to moving to [place]. He tells a colonoscopy was performed by the surgeon who performed his colon resection after her he had perforated diverticulitis. He tells the colonoscopy was normal. He does admit to taking 2 Advil daily. He was found to be Hemoccult positive on digital rectal exam in his PCPs office. He does give history of hemorrhoids as well. He has been having some vague perineal pain and coccygeal pain. He is being treated for prostatitis. He complains of vague discomfort to his entire abdomen that is not necessarily associated with eating or movement. He has not noticed a change in his bowel habits and denies any constipation or diarrhea. He denies any rectal bleeding or melena. He denies any history of heartburn, indigestion, dysphagia, or odynophagia.  His wt has remained stable.  Normal H/H 8/24.  Past Medical History  Diagnosis Date  . Old myocardial infarction   . Unspecified essential hypertension   . Chest pain, unspecified   . Encounter for long-term (current) use of other medications   . Postsurgical percutaneous transluminal coronary angioplasty status   . Hyperlipidemia   . Statin intolerance     history of  . Diverticulitis     history of adhesions  . Osteoarthritis   . Degenerative disk disease   . Sleep apnea   . Restless legs syndrome   . S/P colonoscopy     Patient tells me this was benign, done in [place] Dr. Orpha Bur 1.5 years ago  . Prostatitis     Past Surgical History  Procedure Date  . Post fusion of c6-c7   . Colon resection     with colostomy and  subsequent reversal for perforated diverticulitis  . Hernia repair     incisional  . Vasectomy   . Coronary artery bypass graft   06/20/2010      [name], M.D. (LIMA LAD, SVG RI, SVG OM, SVG PL)  . Pci and stenting of one vessel  2006  . Tonsillectomy and uvulectomy.     Sleep apnea  . Ulnar nerve surgery     Right  . Shoulder surgery     Left    Current Outpatient Prescriptions  Medication Sig Dispense Refill  . acetaminophen (TYLENOL) 325 MG tablet Take 650 mg by mouth every 6 (six) hours as needed.        Marland Kitchen aspirin 325 MG EC tablet Take 325 mg by mouth daily.        . ciprofloxacin (CIPRO) 500 MG tablet Take 500 mg by mouth 2 (two) times daily.       Marland Kitchen ezetimibe-simvastatin (VYTORIN) 10-10 MG per tablet Take 1 tablet by mouth at bedtime.  30 tablet  5  . ibuprofen (ADVIL,MOTRIN) 800 MG tablet Take 800 mg by mouth 2 (two) times daily.        Marland Kitchen losartan (COZAAR) 50 MG tablet Take 0.5 tablets (25 mg total) by mouth daily.  15 tablet  6  . metoprolol (LOPRESSOR) 50 MG tablet Take 50 mg by mouth daily.        . Probiotic  Product (PHILLIPS COLON HEALTH PO) Take by mouth.          Allergies as of 11/05/2010 - Review Complete 11/05/2010  Allergen Reaction Noted  . Flagyl (metronidazole hcl) Hives 07/08/2010  . Statins  07/08/2010    Family History: There is no known family history of colorectal carcinoma , liver disease, or inflammatory bowel disease.  Problem Relation Age of Onset  . Coronary artery disease Father     strong family history  . Arthritis Mother     History   Social History  . Marital Status: Married    Spouse Name: N/A    Number of Children: 2  . Years of Education: N/A   Occupational History  . [employer]     FACILITY MANAGER   Social History Main Topics  . Smoking status: Former Smoker -- 1.0 packs/day for 8 years    Types: Cigarettes    Quit date: 03/02/1984  . Smokeless tobacco: Not on file   Comment:  quit smoking 25 years ago  .  Alcohol Use: No  . Drug Use: No  . Sexually Active: Not on file   Other Topics Concern  . Not on file   Social History Narrative     SOCIAL HISTORY:  The patient is married, with two children, one who is  in college and Theatre stage manager and another who is a Holiday representative in high  school.  The patient has just recently relocated to [place], [place]  having previously lived in [place].  He works as a Social research officer, government for [employer].  He has a remote history of tobacco  use, but he quit smoking 25 years ago.       Review of Systems: Gen: Notes lightheadedness and dizziness while doing cardiac rehabilitation. Complains of fatigue. CV: Occasional chest pain with cardiac rehabilitation. Denies angina, palpitations, syncope, orthopnea, PND, peripheral edema, and claudication. Resp: Denies dyspnea at rest, dyspnea with exercise, cough, sputum, wheezing, coughing up blood, and pleurisy. GI: Denies vomiting blood, jaundice, and fecal incontinence.   Denies dysphagia or odynophagia. GU : Denies urinary burning, blood in urine, urinary frequency, urinary hesitancy, nocturnal urination, and urinary incontinence. MS: Complains of recent strain to bicep during cardiac rehabilitation. Derm: Denies rash, itching, dry skin, hives, moles, warts, or unhealing ulcers. Feels as though his skin is very pale. Psych: Denies depression, anxiety, memory loss, suicidal ideation, hallucinations, paranoia, and confusion. Heme: Denies bruising, bleeding, and enlarged lymph nodes.  Physical Exam: BP 120/77  Pulse 74  Temp(Src) 97.3 F (36.3 C) (Temporal)  Ht 5\' 10"  (1.778 m)  Wt 248 lb 6.4 oz (112.674 kg)  BMI 35.64 kg/m2 General:   Alert,  Well-developed, well-nourished, pleasant and cooperative in NAD Head:  Normocephalic and atraumatic. Eyes:  Sclera clear, no icterus.   Conjunctiva pink. Ears:  Normal auditory acuity. Nose:  No deformity, discharge,  or lesions. Mouth:  No deformity or lesions,  dentition normal. Neck:  Supple; no masses or thyromegaly. Lungs:  Clear throughout to auscultation.   No wheezes, crackles, or rhonchi. No acute distress. Heart:  Regular rate and rhythm; no murmurs, clicks, rubs,  or gallops. Abdomen:  Soft, nontender and nondistended. No masses, hepatosplenomegaly or hernias noted. Normal bowel sounds, without guarding, and without rebound.   Rectal:  Deferred. Msk:  Symmetrical without gross deformities. Normal posture. Pulses:  Normal pulses noted. Extremities:  Without clubbing or edema. Neurologic:  Alert and  oriented x4;  grossly normal neurologically. Skin:  Pale, Intact without significant lesions or rashes. Cervical Nodes:  No significant cervical adenopathy. Psych:  Alert and cooperative. Normal mood and affect.  Labs:  8/24 CBC normal.

## 2010-11-05 NOTE — Assessment & Plan Note (Addendum)
[name] is a 55 y.o. caucasian male found to be Hemoccult-positive on exam. His prior colonoscopy was 1.5 years ago in [place]. He tells me his exam was normal, although I don't have records. He is on aspirin and Advil but is not anemic.  Vague generalized abd pain and denies any upper abdominal concerns.  I discussed w/ Dr Jena Gauss.  Plan to proceed w/ EGD & colonoscopy to determine etiology of his hemoccult positive stool.

## 2010-11-06 ENCOUNTER — Other Ambulatory Visit: Payer: Self-pay | Admitting: Urgent Care

## 2010-11-06 DIAGNOSIS — D649 Anemia, unspecified: Secondary | ICD-10-CM

## 2010-11-06 NOTE — Progress Notes (Signed)
Cc to PCP 

## 2010-11-06 NOTE — Progress Notes (Signed)
LM on VM for return call to setup EGD & colonoscopy for heme positive stool w/ RMR I did discuss this at OV including risks/benefits, but if he has any questions, I can speak w/ him. Please arrange.  Thanks

## 2010-11-06 NOTE — Progress Notes (Signed)
Quick Note:  Tried to call pt- LMOM ______ 

## 2010-11-10 LAB — COMPREHENSIVE METABOLIC PANEL
ALT: 15 U/L (ref 10–40)
AST: 17 U/L
Albumin: 4.5
CO2: 26 mmol/L
Calcium: 10 mg/dL
Chloride: 107 mmol/L
Creat: 1
Hgb A1c MFr Bld: 6 % (ref 4.0–6.0)
Potassium: 4.9 mmol/L

## 2010-11-10 LAB — CBC
HCT: 47 %
platelet count: 326

## 2010-11-11 NOTE — Progress Notes (Signed)
Labs reviewed from 11/10/10 CBC normal, CMP normal, hemoglobin A1c 6

## 2010-11-12 NOTE — Progress Notes (Signed)
Pt called back to schedule but told me he does not have anyone that can drive him home after the procedure and if he is required to have someone then he will just cancel

## 2010-11-12 NOTE — Progress Notes (Signed)
LMOVM for pt to call us back to set up procedures

## 2010-11-12 NOTE — Progress Notes (Signed)
Please let pt know that I said he has to have a driver due to the medications & we cannot do it any other way. Please call us when he is ready to arrange. Must be done within 30 days of his OV. Thanks

## 2010-11-13 ENCOUNTER — Other Ambulatory Visit: Payer: Self-pay | Admitting: Gastroenterology

## 2010-11-13 DIAGNOSIS — R195 Other fecal abnormalities: Secondary | ICD-10-CM

## 2010-11-13 NOTE — Progress Notes (Signed)
Pt is scheduled for 11/24/10- Instructions mailed

## 2010-11-19 ENCOUNTER — Other Ambulatory Visit (HOSPITAL_COMMUNITY): Payer: Self-pay | Admitting: Physician Assistant

## 2010-11-19 DIAGNOSIS — M533 Sacrococcygeal disorders, not elsewhere classified: Secondary | ICD-10-CM

## 2010-11-21 ENCOUNTER — Ambulatory Visit (HOSPITAL_COMMUNITY)
Admission: RE | Admit: 2010-11-21 | Discharge: 2010-11-21 | Disposition: A | Discharge status: Home / Self Care | Payer: Managed Care, Other (non HMO) | Source: Ambulatory Visit | Attending: Physician Assistant | Admitting: Physician Assistant

## 2010-11-21 DIAGNOSIS — M5137 Other intervertebral disc degeneration, lumbosacral region: Secondary | ICD-10-CM | POA: Insufficient documentation

## 2010-11-21 DIAGNOSIS — M51379 Other intervertebral disc degeneration, lumbosacral region without mention of lumbar back pain or lower extremity pain: Secondary | ICD-10-CM | POA: Insufficient documentation

## 2010-11-21 DIAGNOSIS — M533 Sacrococcygeal disorders, not elsewhere classified: Secondary | ICD-10-CM | POA: Insufficient documentation

## 2010-11-21 DIAGNOSIS — K625 Hemorrhage of anus and rectum: Secondary | ICD-10-CM | POA: Insufficient documentation

## 2010-11-21 MED ORDER — SODIUM CHLORIDE 0.45 % IV SOLN
Freq: Once | INTRAVENOUS | Status: AC
  Administered 2010-11-24: 11:00:00 via INTRAVENOUS

## 2010-11-24 ENCOUNTER — Other Ambulatory Visit: Payer: Self-pay | Admitting: Internal Medicine

## 2010-11-24 ENCOUNTER — Encounter (HOSPITAL_COMMUNITY): Payer: Self-pay | Admitting: *Deleted

## 2010-11-24 ENCOUNTER — Ambulatory Visit (HOSPITAL_COMMUNITY)
Admission: RE | Admit: 2010-11-24 | Discharge: 2010-11-24 | Disposition: A | Discharge status: Home / Self Care | Payer: Managed Care, Other (non HMO) | Source: Ambulatory Visit | Attending: Internal Medicine | Admitting: Internal Medicine

## 2010-11-24 ENCOUNTER — Encounter (HOSPITAL_COMMUNITY)
Admission: RE | Disposition: A | Discharge status: Home / Self Care | Payer: Self-pay | Source: Ambulatory Visit | Attending: Internal Medicine

## 2010-11-24 DIAGNOSIS — R195 Other fecal abnormalities: Secondary | ICD-10-CM

## 2010-11-24 DIAGNOSIS — R109 Unspecified abdominal pain: Secondary | ICD-10-CM

## 2010-11-24 DIAGNOSIS — K2960 Other gastritis without bleeding: Secondary | ICD-10-CM

## 2010-11-24 DIAGNOSIS — G4733 Obstructive sleep apnea (adult) (pediatric): Secondary | ICD-10-CM | POA: Insufficient documentation

## 2010-11-24 DIAGNOSIS — I10 Essential (primary) hypertension: Secondary | ICD-10-CM | POA: Insufficient documentation

## 2010-11-24 DIAGNOSIS — K2940 Chronic atrophic gastritis without bleeding: Secondary | ICD-10-CM | POA: Insufficient documentation

## 2010-11-24 DIAGNOSIS — E785 Hyperlipidemia, unspecified: Secondary | ICD-10-CM | POA: Insufficient documentation

## 2010-11-24 DIAGNOSIS — Z79899 Other long term (current) drug therapy: Secondary | ICD-10-CM | POA: Insufficient documentation

## 2010-11-24 DIAGNOSIS — K2100 Gastro-esophageal reflux disease with esophagitis, without bleeding: Secondary | ICD-10-CM | POA: Insufficient documentation

## 2010-11-24 DIAGNOSIS — K5730 Diverticulosis of large intestine without perforation or abscess without bleeding: Secondary | ICD-10-CM

## 2010-11-24 DIAGNOSIS — K921 Melena: Secondary | ICD-10-CM | POA: Insufficient documentation

## 2010-11-24 DIAGNOSIS — Z7982 Long term (current) use of aspirin: Secondary | ICD-10-CM | POA: Insufficient documentation

## 2010-11-24 SURGERY — COLONOSCOPY WITH ESOPHAGOGASTRODUODENOSCOPY (EGD)
Anesthesia: Moderate Sedation

## 2010-11-24 MED ORDER — MEPERIDINE HCL 100 MG/ML IJ SOLN
INTRAMUSCULAR | Status: DC | PRN
  Administered 2010-11-24: 25 mg via INTRAVENOUS
  Administered 2010-11-24: 50 mg via INTRAVENOUS
  Administered 2010-11-24: 25 mg via INTRAVENOUS

## 2010-11-24 MED ORDER — MIDAZOLAM HCL 5 MG/5ML IJ SOLN
INTRAMUSCULAR | Status: AC
  Filled 2010-11-24: qty 10

## 2010-11-24 MED ORDER — BUTAMBEN-TETRACAINE-BENZOCAINE 2-2-14 % EX AERO
INHALATION_SPRAY | CUTANEOUS | Status: DC | PRN
  Administered 2010-11-24: 2 via TOPICAL

## 2010-11-24 MED ORDER — MEPERIDINE HCL 100 MG/ML IJ SOLN
INTRAMUSCULAR | Status: AC
  Filled 2010-11-24: qty 2

## 2010-11-24 MED ORDER — MIDAZOLAM HCL 5 MG/5ML IJ SOLN
INTRAMUSCULAR | Status: DC | PRN
  Administered 2010-11-24: 2 mg via INTRAVENOUS
  Administered 2010-11-24: 1 mg via INTRAVENOUS
  Administered 2010-11-24: 2 mg via INTRAVENOUS

## 2010-12-01 ENCOUNTER — Inpatient Hospital Stay (HOSPITAL_COMMUNITY)
Admission: RE | Admit: 2010-12-01 | Discharge: 2010-12-04 | DRG: 247 | Disposition: A | Discharge status: Home / Self Care | Payer: Managed Care, Other (non HMO) | Source: Ambulatory Visit | Attending: Cardiovascular Disease | Admitting: Cardiovascular Disease

## 2010-12-01 DIAGNOSIS — I2582 Chronic total occlusion of coronary artery: Secondary | ICD-10-CM | POA: Diagnosis present

## 2010-12-01 DIAGNOSIS — Z981 Arthrodesis status: Secondary | ICD-10-CM

## 2010-12-01 DIAGNOSIS — K5730 Diverticulosis of large intestine without perforation or abscess without bleeding: Secondary | ICD-10-CM | POA: Diagnosis present

## 2010-12-01 DIAGNOSIS — F411 Generalized anxiety disorder: Secondary | ICD-10-CM | POA: Diagnosis present

## 2010-12-01 DIAGNOSIS — Z6836 Body mass index (BMI) 36.0-36.9, adult: Secondary | ICD-10-CM

## 2010-12-01 DIAGNOSIS — Z87891 Personal history of nicotine dependence: Secondary | ICD-10-CM

## 2010-12-01 DIAGNOSIS — I209 Angina pectoris, unspecified: Secondary | ICD-10-CM | POA: Diagnosis present

## 2010-12-01 DIAGNOSIS — I25810 Atherosclerosis of coronary artery bypass graft(s) without angina pectoris: Principal | ICD-10-CM | POA: Diagnosis present

## 2010-12-01 DIAGNOSIS — F3289 Other specified depressive episodes: Secondary | ICD-10-CM | POA: Diagnosis present

## 2010-12-01 DIAGNOSIS — R42 Dizziness and giddiness: Secondary | ICD-10-CM | POA: Diagnosis present

## 2010-12-01 DIAGNOSIS — Z79899 Other long term (current) drug therapy: Secondary | ICD-10-CM

## 2010-12-01 DIAGNOSIS — F329 Major depressive disorder, single episode, unspecified: Secondary | ICD-10-CM | POA: Diagnosis present

## 2010-12-01 DIAGNOSIS — I2510 Atherosclerotic heart disease of native coronary artery without angina pectoris: Secondary | ICD-10-CM | POA: Diagnosis present

## 2010-12-01 DIAGNOSIS — Z7982 Long term (current) use of aspirin: Secondary | ICD-10-CM

## 2010-12-01 DIAGNOSIS — I498 Other specified cardiac arrhythmias: Secondary | ICD-10-CM | POA: Diagnosis present

## 2010-12-01 DIAGNOSIS — E785 Hyperlipidemia, unspecified: Secondary | ICD-10-CM | POA: Diagnosis present

## 2010-12-01 DIAGNOSIS — E669 Obesity, unspecified: Secondary | ICD-10-CM | POA: Diagnosis present

## 2010-12-01 DIAGNOSIS — I252 Old myocardial infarction: Secondary | ICD-10-CM

## 2010-12-01 HISTORY — PX: CORONARY STENT PLACEMENT: SHX1402

## 2010-12-01 LAB — URINALYSIS, ROUTINE W REFLEX MICROSCOPIC
Bilirubin Urine: NEGATIVE
Glucose, UA: NEGATIVE mg/dL
Ketones, ur: NEGATIVE mg/dL
Leukocytes, UA: NEGATIVE
pH: 6 (ref 5.0–8.0)

## 2010-12-01 LAB — CBC
HCT: 42.8 % (ref 39.0–52.0)
Hemoglobin: 15 g/dL (ref 13.0–17.0)
MCH: 29.6 pg (ref 26.0–34.0)
MCHC: 35 g/dL (ref 30.0–36.0)

## 2010-12-01 LAB — BASIC METABOLIC PANEL
BUN: 18 mg/dL (ref 6–23)
CO2: 26 mEq/L (ref 19–32)
Chloride: 104 mEq/L (ref 96–112)
Creatinine, Ser: 0.84 mg/dL (ref 0.50–1.35)
Glucose, Bld: 102 mg/dL — ABNORMAL HIGH (ref 70–99)

## 2010-12-02 LAB — CBC
Hemoglobin: 14 g/dL (ref 13.0–17.0)
MCH: 28.7 pg (ref 26.0–34.0)
MCV: 83.6 fL (ref 78.0–100.0)
RBC: 4.87 MIL/uL (ref 4.22–5.81)

## 2010-12-02 LAB — BASIC METABOLIC PANEL
BUN: 13 mg/dL (ref 6–23)
Chloride: 105 mEq/L (ref 96–112)
GFR calc Af Amer: >90 mL/min (ref >90)
Potassium: 3.8 mEq/L (ref 3.5–5.1)

## 2010-12-02 LAB — PROTIME-INR: Prothrombin Time: 12.7 seconds (ref 11.6–15.2)

## 2010-12-03 LAB — BASIC METABOLIC PANEL
BUN: 14 mg/dL (ref 6–23)
CO2: 24 mEq/L (ref 19–32)
Chloride: 105 mEq/L (ref 96–112)
GFR calc non Af Amer: >90 mL/min (ref >90)
Glucose, Bld: 116 mg/dL — ABNORMAL HIGH (ref 70–99)
Potassium: 4 mEq/L (ref 3.5–5.1)

## 2010-12-03 LAB — CBC
HCT: 42.7 % (ref 39.0–52.0)
Hemoglobin: 14.9 g/dL (ref 13.0–17.0)
MCHC: 34.9 g/dL (ref 30.0–36.0)
RBC: 5.06 MIL/uL (ref 4.22–5.81)

## 2010-12-03 NOTE — Cardiovascular Report (Signed)
NAME:  [name], [name]                 ACCOUNT NO.:  [id number]  MEDICAL RECORD NO.:  [id number]  LOCATION:  2502                         FACILITY:  [facility]  PHYSICIAN:  Nicki Guadalajara, M.D.     DATE OF BIRTH:  [birth year]  DATE OF PROCEDURE:  12/01/2010 DATE OF DISCHARGE:                           CARDIAC CATHETERIZATION   PROCEDURES:  Left heart catheterization:  Cine coronary angiography; injection into the saphenous vein graft; selective injection into the left internal mammary artery; central aortography; biplane cine left ventriculography.  INDICATIONS:  [name] is a 55 year old gentleman who has a longstanding history of coronary artery disease.  Reportedly in 2004, he did suffer a myocardial infarction.  Approximately 4 years later, apparently underwent stenting of the circumflex vessel.  These prior procedures I believed have been done in [place] in [place].  In April, the patient suffered a non-STEMI, was found to have significant progressive coronary artery disease and underwent CABG revascularization surgery by Dr. Cornelius Moras in April 2012 with a LIMA graft placed to the distal LAD, the vein graft to the ramus intermediate and obtuse marginal branch, and vein graft to the posterolateral branch of the right coronary artery.  The patient had done well initially.  Over the past month, he has developed recurrent symptomatology of angina.  Apparently, he has seen Dr. Royann Shivers in the office in followup.  He underwent a nuclear perfusion study last week, which showed moderate ischemia in mid inferior basal, inferolateral basal, and anterolateral region.  Size of capacity was 10 mets.  He now presents for catheterization following his abnormal nuclear scan.  PROCEDURE:  After premedication with Versed 2 mg plus fentanyl 50 mcg, the patient was prepped and draped in usual fashion.  His right femoral artery was punctured anteriorly and a 5-French sheath was inserted without  difficulty.  Diagnostic catheterization was done utilizing 5- [place] Judkins 4 left and right coronary catheters.  Attempts were made at cannulating the vein graft with the right catheter.  Ultimately, a right bypass graft catheter as well as a left bypass graft catheter were utilized, which demonstrated nubbins at both vein grafts.  A LIMA catheter was used for selective angiography and left internal mammary artery.  A 5-French pigtail catheter was then inserted.  Since both vein grafts were not visualized selectively and did appear to have nubbins with occlusion.  Central aortography was also performed to verify absent visualization of these vein grafts.  Biplane cine left ventriculography was performed.  The patient tolerated the procedure well.  Hemostasis was obtained by direct manual pressure.  HEMODYNAMIC DATA:  Central aortic pressure is 115/80.  Left ventricle pressure 115/15.  ANGIOGRAPHIC DATA:  Left main coronary artery was angiographically normal and bifurcated into an LAD and left circumflex system.  The LAD gave rise to proximal diagonal septal perforating artery.  There was 80 and then 90% stenosis with flush and fill phenomenon due to competitive LIMA filling in the mid segment.  The circumflex vessel had previously placed proximal stent with in-stent narrowing of approximately 40%.  The OM1 vessel arose just beyond the stented segment.  There was proximal 85% diffuse narrowing in this OM-1 vessel.  There was then 95% stenosis.  It what appeared to be a site where the vein graft had anastomosed.  There was now aneurysmal dilatation of focal aneurysm at this site followed by 90% stenosis.  The remainder of the AV groove circumflex was free of significant disease.  The native right coronary artery was totally occluded proximally.  The vein graft supplying the right coronary artery was totally occluded. Next, the vein graft which had supplied the intermediate and  marginal vessel was totally occluded.  Next, a LIMA graft was patent.  However, there was focal 80-90% stenosis in the LAD just distal to the LIMA insertion.  This did not significantly improve following 200 mcg of intracoronary nitroglycerin selective injection.  Biplane cine left ventriculography revealed ejection fraction of approximately 50%.  There was mid-to-basal inferior hypocontractility in the RAO projection and focal inferoapical as well as low posterolateral hypocontractility on the LAO projection.  Central aortography did not demonstrate any aortic insufficiency.  There was not any visualization of any saphenous vein grafts arising from the central aorta, concordant with both grafts being occluded at their origin.  IMPRESSION: 1. Mild left ventricular dysfunction with mid-to-basal inferior and     focal inferoapical and low posterolateral hypocontractility.     Ejection fraction is 50%. 2. Significant native coronary artery disease with 80-90% proximal     left anterior descending artery stenoses; 40% in-stent narrowing in     the proximal circumflex with high-grade 85% obtuse marginal 1     stenosis followed by 95-90% circumflex stenoses with apparent     occlusion of the vein graft with aneurysmal dilatation apparently     this anastomotic site. 3. Total occlusion of the proximal right coronary artery. 4. Total occlusion of the vein graft, which had supplied the right     coronary artery. 5. Total occlusion of the vein graft, which had supplied the     intermediate as well as obtuse marginal vessel. 6. Patent left internal mammary artery to the left anterior descending     artery, but 80-90% focal stenosis in the left anterior descending     artery just beyond the left internal mammary artery insertion.  RECOMMENDATIONS:  [name] has undergone CABG revascularization surgery only 53 months ago.  He now has documented graft occlusion of all vein grafts and has  high-grade stenosis in the LAD beyond the LIMA insertion.  Results will be discussed with the patient's primary cardiologist, Dr. Royann Shivers.  Dr. Tressie Stalker will also be notified of the findings.  The patient will be hydrated.  Increased medications will be recommended with plans for probable intervention to the LAD via to the LIMA graft and possibly to the circumflex marginal vessel.          ______________________________ Nicki Guadalajara, M.D.     TK/MEDQ  D:  12/01/2010  T:  12/01/2010  Job:  [id number]  cc:   [name], M.D. Thurmon Fair, MD  Electronically Signed by Nicki Guadalajara M.D. on 12/03/2010 01:07:23 PM

## 2010-12-04 LAB — BASIC METABOLIC PANEL
BUN: 10 mg/dL (ref 6–23)
CO2: 28 mEq/L (ref 19–32)
Calcium: 9.3 mg/dL (ref 8.4–10.5)
Creatinine, Ser: 1.01 mg/dL (ref 0.50–1.35)
Glucose, Bld: 95 mg/dL (ref 70–99)

## 2010-12-04 LAB — CBC
HCT: 41.1 % (ref 39.0–52.0)
Hemoglobin: 14 g/dL (ref 13.0–17.0)
MCH: 29.2 pg (ref 26.0–34.0)
MCV: 85.6 fL (ref 78.0–100.0)
RBC: 4.8 MIL/uL (ref 4.22–5.81)

## 2010-12-09 ENCOUNTER — Encounter: Payer: Self-pay | Admitting: Internal Medicine

## 2010-12-15 NOTE — Cardiovascular Report (Signed)
NAME:  [name], [name]                 ACCOUNT NO.:  [id number]  MEDICAL RECORD NO.:  [id number]  LOCATION:  2502                         FACILITY:  [facility]  PHYSICIAN:  Nicki Guadalajara, M.D.     DATE OF BIRTH:  Dec 19, 1955  DATE OF PROCEDURE: DATE OF DISCHARGE:                           CARDIAC CATHETERIZATION   INDICATIONS:  [name] is a 55 year old gentleman who had suffered a remote MI in 2004 and approximately 4 years later had undergone stenting to his proximal circumflex vessel in [place], [place].  In April 2012, following this suffering a non-STEMI, catheterization revealed progressive CAD and he underwent CABG surgery by Dr. Cornelius Moras and in April 2012 had a LIMA graft placed to the LAD, a sequential vein graft to the ramus intermediate and obtuse marginal graft, and a vein graft to the left posterolateral vessel.  Initially, the patient did well, but over the past month he developed recurrent symptomatology leading to subsequent nuclear stress testing and cardiac catheterization. Catheterization 2 days previously revealed a patent LIMA graft, but he had 80% to 90% stenosis in the LAD beyond the LIMA insertion.  There was 40% mild narrowing in the stented proximal circumflex and there was now 85% stenosis in the OM-1 vessel with 95% to 99% stenosis in the OM-1 vessel prior to an aneurysmal segment, which seemed to be at the site where the graft had been sutured in place followed by 90% stenosis after this.  There was no significant visualization of the graft and the aneurysm arose on the inferior side of the vessel.  Native right coronary artery was totally occluded.  All vein grafts were totally occluded at the origin.  The patient was hydrated aggressively.  He was started on increased medical therapy.  Angiograms were reviewed both with colleagues and Dr. Cornelius Moras was notified.  The patient now presents for intervention to the LAD via LIMA graft.  There is concern about  the aneurysm in the OM vessel with potential risk for intervention here and reassessment of the circumflex territory will be undertaken prior to final decision.  PROCEDURE:  After premedication with Versed 2 mg plus fentanyl 50 mcg, the patient was prepped and draped in usual fashion.  His right femoral artery was punctured anteriorly and a 6-French sheath was inserted without difficulty.  The patient was still somewhat anxious and received an additional 1 mg of Versed plus 25 mcg of fentanyl.  A 6-French LIMA guide was used for the intervention.  Repeat angiography confirmed a patent LIMA artery, but evidence for 90% focal stenosis beyond the anastomosis, which did not improve following IC nitroglycerin administration.  The patient was administered Angiomax.  ACT was documented as therapeutic.  On December 01, 2010, following his diagnostic study he had already been loaded with 300 mg of Plavix, had received 75 mg of Plavix yesterday and this morning, and received an additional 75 mg of Plavix in the laboratory.  Predilatation in the LAD was done with a 2.5 x 12-mm Emerge PTCA balloon.  A 2.75 x 12-mm PROMUS Element DES stent was used and this was inflated to 13 and 14 atmospheres.  A 3.0 x 8-mm noncompliant Quantum balloon was  used for post-stent dilatation. Scout angiography confirmed an excellent angiographic result.  Attention was also directed looking at the collaterals, which seemed to be more vigorous and now supplying the obtuse marginal branch such that now this is well collateralized despite having the graft occluded to this segment.  A diagnostic left 6-French catheter was used to relocate the circumflex system.  Again it was noted there was improved collateralization to the ramus intermediate vessel via the left system.  There was 40% mild narrowing in the previously placed proximal circumflex stent.  The OM-1 vessel had diffuse 85% proximal stenosis and gave rise to a small  branch within this segment.  The vessel in its midsegment was then 99% stenosed prior to definite focal aneurysm, which came off the inferior aspect of the vessel on the contrary side and there was a slight visualization of the hairline of the graft immediately beyond this and superior.  There was 90% stenosis in an upward takeoff portion after this aneurysm.  The distal circumflex gave rise to two large dominant vessels, and there was suggestion of the potential for beginnings of collateralization to this distal OM vessel.  Because of the potential increased risk with the aneurysm and improved collateralization, the decision was made to initially treat the circumflex with increased medical therapy approach rather than intervention.  The arterial sheath was sutured in place. Bivalirudin will be continued for an additional 2 hours at low dose. The patient tolerated the procedure well.  HEMODYNAMIC DATA:  Central aortic pressure was 105/74.  ANGIOGRAPHIC DATA:  Initial injection of the left internal mammary artery revealed this to be widely patent.  In the mid LAD beyond the anastomosis in the LAD just after the anastomosis was a focal 90% stenosis, which did not improve following IC nitroglycerin administration.  Following PTCA and successful stenting with a 2.75 x 12- mm PROMUS Element DES stent postdilated to 2.9 to approximately 3 mm, the 90% stenosis was reduced to 0%.  There was now improved collateralization to the ramus intermediate vessel which had been previously supplied by the occluded sequential graft, which had supplied the ramus intermediate and OM vessel.  Relook of the circumflex system again showed 40% in-stent narrowing in the proximal stent.  The circumflex was dominant vessel.  There was 80% stenosis in the OM-1 vessel followed by 90 in the OM-1 vessel proximally.  In the mid segment, there was an aneurysm with 99% stenosis just proximal to the aneurysm with 90%  stenosis in the portion just immediate after the aneurysm.  The distal circumflex PLA and PD vessels were large-caliber vessels extending into the inferior wall with potential for improvement in collateralization to the OM vessel.  IMPRESSION: 1. Successful percutaneous coronary intervention to the LAD via the     LIMA graft with ultimate insertion of 2.75 x 12 mm PROMUS Element     DES stent postdilated to approximately 2.95 to 3.0 mm with the 90%     stenosis being reduced to 0%. 2. Bivalirudin/IC nitroglycerin/oral clopidogrel.  PLAN:  Increased medical therapy for the circumflex system.          ______________________________ Nicki Guadalajara, M.D.     TK/MEDQ  D:  12/03/2010  T:  12/03/2010  Job:  [id number]  cc:   Thurmon Fair, MD [name], M.D.  Electronically Signed by Nicki Guadalajara M.D. on 12/15/2010 04:10:11 PM

## 2010-12-15 NOTE — Discharge Summary (Signed)
NAMEMynor, [name]                 ACCOUNT NO.:  [id number]  MEDICAL RECORD NO.:  [id number]  LOCATION:  2502                         FACILITY:  [facility]  PHYSICIAN:  Nicki Guadalajara, M.D.     DATE OF BIRTH:  10-05-1955  DATE OF ADMISSION:  12/01/2010 DATE OF DISCHARGE:                              DISCHARGE SUMMARY   DISCHARGE DIAGNOSES: 1. Angina with positive nuclear stress test. 2. Coronary disease with history of bypass grafting in April 2012, now     with cardiac cath revealing graft dysfunction with the vein graft     to the right coronary artery and vein graft to the intermediate and     marginal total occluded.     a.     Left internal mammary artery was patent, but with 80-90%      stenosis distal to the insertion site of the left internal mammary      artery undergoing percutaneous transluminal coronary angioplasty      and stent deployment with a Promus drug-eluting stent.     b.     Ejection fraction 50%. 3. 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 4. Dizziness with medications, will need to monitor. 5. Anxiety/depression. 6. Dyslipidemia. 7. Obesity with BMI of 36.  CONSULTATION:  With Dr. Cornelius Moras.  Concerning redo bypass which is a possibility, but best course was felt to be PCI of the distal LAD.  DISCHARGE MEDICATIONS:  See medication reconciliation sheet.  Ranexa and Imdur were added and Vytorin was increased to 10/20 daily.  DISCHARGE INSTRUCTIONS: 1. Increase activity slowly.  May shower.  No lifting for 1 week.  No     driving for 2 days. 2. Low-sodium heart-healthy diet. 3. Wash right groin cath site with soap and water.  Call if any     bleeding, swelling or drainage.  4.  Follow up with Dr. Royann Shivers in     [place] on December 18, 2010, at 10:15 a.m..  Call Dr. Royann Shivers     if increased dizziness.  HOSPITAL COURSE:  A 55 year old male with a long history of coronary disease with MI in 2004.  A couple of years later, a second MI and received a stent to the  left circumflex and then earlier this year in April, underwent cardiac cath after non-ST elevation MI and this let him to a four-vessel bypass grafting.  He had done quite well until the last month  He was having dizziness and chest tightness with exertion and then they began occurring at rest.  He underwent a stress test which was positive for ischemia.  Because of the positive stress test, he was brought in electively for cardiac catheterization.  Please note also the patient has a history of obstructive sleep apnea, but was reportedly cured following uvulopharyngoplasty.  He has been also intolerant to numerous statins, Lipitor, Crestor, Pravachol causing confusion and memory loss.  The Vytorin so far has done quite well for the patient, but he was worried prior to this admission.  This sounded that symptoms with confusion and memory loss may be recurring.  Other history of partial colectomy of the sigmoid colon with creation of a Hartmann pouch for  bleeding, diverticulosis, diverticulitis.  He had a colostomy performed in 2010 with a reversal in 2011.  He has had cervical spine fusion in 2008 and shoulder reconstruction on the left in 1978 and 1979. He stopped tobacco 24 years ago.  During the hospitalization here, he was concerned about decreasing heart rate and blood pressure and feeling too dizzy to function.  He is on metoprolol 25 b.i.d. and with 50 b.i.d., had bradycardia of 49.  LABS AT DISCHARGE:  Sodium 141, potassium 4.3, BUN 10, creatinine 1.01 and glucose 95.  Hemoglobin 14, hematocrit 41.1, platelets 239, and WBC 7.5.  TSH was 2.351.  UA was clear.  Protime 12.4 with INR 0.91.  His last chest x-ray had been in June and that was within normal with awareness of stable bypass and minimal lateral left basilar linear scarring or atelectasis.  The patient will ambulate with cardiac rehab prior to discharge.     [name]. [name],  N.P.   ______________________________ Nicki Guadalajara, M.D.    LRI/MEDQ  D:  12/04/2010  T:  12/04/2010  Job:  [id number]  cc:   Thurmon Fair, MD R. Roetta Sessions, MD FACP Dalbert Mayotte L. [address], [place]  Electronically Signed by Nada Boozer N.P. on 12/04/2010 01:11:47 PM Electronically Signed by Nicki Guadalajara M.D. on 12/15/2010 04:10:06 PM

## 2011-10-13 ENCOUNTER — Encounter (HOSPITAL_COMMUNITY)
Admission: RE | Admit: 2011-10-13 | Discharge: 2011-10-13 | Disposition: A | Discharge status: Home / Self Care | Payer: Managed Care, Other (non HMO) | Source: Ambulatory Visit | Attending: Cardiovascular Disease | Admitting: Cardiovascular Disease

## 2011-10-13 ENCOUNTER — Encounter (HOSPITAL_COMMUNITY): Payer: Self-pay

## 2011-10-13 VITALS — BP 110/82 | HR 66 | Ht 70.0 in | Wt 259.3 lb

## 2011-10-13 DIAGNOSIS — I2109 ST elevation (STEMI) myocardial infarction involving other coronary artery of anterior wall: Secondary | ICD-10-CM

## 2011-10-13 DIAGNOSIS — Z5189 Encounter for other specified aftercare: Secondary | ICD-10-CM | POA: Insufficient documentation

## 2011-10-13 DIAGNOSIS — Z9861 Coronary angioplasty status: Secondary | ICD-10-CM

## 2011-10-13 NOTE — Patient Instructions (Signed)
Pt has finished orientation and is scheduled to start CR on 10/14/11 at 6:45 am. Pt has been instructed to arrive to class 15 minutes early for scheduled class. Pt has been instructed to wear comfortable clothing and shoes with rubber soles. Pt has been told to take their medications 1 hour prior to coming to class.  If the patient is not going to attend class, he/she has been instructed to call.

## 2011-10-13 NOTE — Progress Notes (Signed)
Patient has been referred to Cardiac Rehab by Dr. Royann Shivers at [facility]. Qualifying diagnosis Stentx1 V45.82 and CABGx4 410.01. During orientation advised patient on arrival and appointment times what to wear, what to do before, during and after exercise. Reviewed attendance and class policy. Talked about inclement weather and class consultation policy. Pt is scheduled to start Cardiac Rehab on 10/14/11 at 6:45 am. Pt was advised to come to class 5 minutes before class starts. He was also given instructions on meeting with the dietician and attending the Family Structure classes. Pt is eager to get started.

## 2011-10-14 ENCOUNTER — Encounter (HOSPITAL_COMMUNITY)
Admission: RE | Admit: 2011-10-14 | Discharge: 2011-10-14 | Disposition: A | Discharge status: Home / Self Care | Payer: Managed Care, Other (non HMO) | Source: Ambulatory Visit | Attending: Cardiovascular Disease | Admitting: Cardiovascular Disease

## 2011-10-16 ENCOUNTER — Encounter (HOSPITAL_COMMUNITY)
Admission: RE | Admit: 2011-10-16 | Discharge: 2011-10-16 | Disposition: A | Discharge status: Home / Self Care | Payer: Managed Care, Other (non HMO) | Source: Ambulatory Visit | Attending: Cardiovascular Disease | Admitting: Cardiovascular Disease

## 2011-10-19 ENCOUNTER — Encounter (HOSPITAL_COMMUNITY): Payer: Managed Care, Other (non HMO)

## 2011-10-21 ENCOUNTER — Encounter (HOSPITAL_COMMUNITY)
Admission: RE | Admit: 2011-10-21 | Discharge: 2011-10-21 | Disposition: A | Discharge status: Home / Self Care | Payer: Managed Care, Other (non HMO) | Source: Ambulatory Visit | Attending: Cardiovascular Disease | Admitting: Cardiovascular Disease

## 2011-10-23 ENCOUNTER — Encounter (HOSPITAL_COMMUNITY)
Admission: RE | Admit: 2011-10-23 | Discharge: 2011-10-23 | Disposition: A | Discharge status: Home / Self Care | Payer: Managed Care, Other (non HMO) | Source: Ambulatory Visit | Attending: Cardiovascular Disease | Admitting: Cardiovascular Disease

## 2011-10-26 ENCOUNTER — Encounter (HOSPITAL_COMMUNITY)
Admission: RE | Admit: 2011-10-26 | Discharge: 2011-10-26 | Disposition: A | Discharge status: Home / Self Care | Payer: Managed Care, Other (non HMO) | Source: Ambulatory Visit | Attending: Cardiovascular Disease | Admitting: Cardiovascular Disease

## 2011-10-28 ENCOUNTER — Encounter (HOSPITAL_COMMUNITY)
Admission: RE | Admit: 2011-10-28 | Discharge: 2011-10-28 | Disposition: A | Discharge status: Home / Self Care | Payer: Managed Care, Other (non HMO) | Source: Ambulatory Visit | Attending: Cardiovascular Disease | Admitting: Cardiovascular Disease

## 2011-10-30 ENCOUNTER — Encounter (HOSPITAL_COMMUNITY): Payer: Managed Care, Other (non HMO)

## 2011-11-02 ENCOUNTER — Encounter (HOSPITAL_COMMUNITY): Payer: Managed Care, Other (non HMO)

## 2011-11-04 ENCOUNTER — Encounter (HOSPITAL_COMMUNITY): Payer: Managed Care, Other (non HMO)

## 2011-11-06 ENCOUNTER — Encounter (HOSPITAL_COMMUNITY)
Admission: RE | Admit: 2011-11-06 | Discharge: 2011-11-06 | Disposition: A | Discharge status: Home / Self Care | Payer: Managed Care, Other (non HMO) | Source: Ambulatory Visit | Attending: Cardiovascular Disease | Admitting: Cardiovascular Disease

## 2011-11-06 DIAGNOSIS — Z5189 Encounter for other specified aftercare: Secondary | ICD-10-CM | POA: Insufficient documentation

## 2011-11-06 DIAGNOSIS — I2109 ST elevation (STEMI) myocardial infarction involving other coronary artery of anterior wall: Secondary | ICD-10-CM | POA: Insufficient documentation

## 2011-11-09 ENCOUNTER — Encounter (HOSPITAL_COMMUNITY)
Admission: RE | Admit: 2011-11-09 | Discharge: 2011-11-09 | Disposition: A | Discharge status: Home / Self Care | Payer: Managed Care, Other (non HMO) | Source: Ambulatory Visit | Attending: Cardiovascular Disease | Admitting: Cardiovascular Disease

## 2011-11-11 ENCOUNTER — Encounter (HOSPITAL_COMMUNITY): Payer: Managed Care, Other (non HMO)

## 2011-11-13 ENCOUNTER — Encounter (HOSPITAL_COMMUNITY): Payer: Managed Care, Other (non HMO)

## 2011-11-16 ENCOUNTER — Encounter (HOSPITAL_COMMUNITY): Payer: Managed Care, Other (non HMO)

## 2011-11-18 ENCOUNTER — Encounter (HOSPITAL_COMMUNITY): Payer: Managed Care, Other (non HMO)

## 2011-11-20 ENCOUNTER — Encounter (HOSPITAL_COMMUNITY)
Admission: RE | Admit: 2011-11-20 | Discharge: 2011-11-20 | Disposition: A | Discharge status: Home / Self Care | Payer: Managed Care, Other (non HMO) | Source: Ambulatory Visit | Attending: Cardiovascular Disease | Admitting: Cardiovascular Disease

## 2011-11-23 ENCOUNTER — Encounter (HOSPITAL_COMMUNITY)
Admission: RE | Admit: 2011-11-23 | Discharge: 2011-11-23 | Disposition: A | Discharge status: Home / Self Care | Payer: Managed Care, Other (non HMO) | Source: Ambulatory Visit | Attending: Cardiovascular Disease | Admitting: Cardiovascular Disease

## 2011-11-24 ENCOUNTER — Other Ambulatory Visit (HOSPITAL_COMMUNITY): Payer: Self-pay | Admitting: Family Medicine

## 2011-11-24 ENCOUNTER — Ambulatory Visit (HOSPITAL_COMMUNITY)
Admission: RE | Admit: 2011-11-24 | Discharge: 2011-11-24 | Disposition: A | Discharge status: Home / Self Care | Payer: Managed Care, Other (non HMO) | Source: Ambulatory Visit | Attending: Family Medicine | Admitting: Family Medicine

## 2011-11-24 DIAGNOSIS — R509 Fever, unspecified: Secondary | ICD-10-CM | POA: Insufficient documentation

## 2011-11-24 DIAGNOSIS — J069 Acute upper respiratory infection, unspecified: Secondary | ICD-10-CM

## 2011-11-24 DIAGNOSIS — J209 Acute bronchitis, unspecified: Secondary | ICD-10-CM

## 2011-11-24 DIAGNOSIS — Z951 Presence of aortocoronary bypass graft: Secondary | ICD-10-CM | POA: Insufficient documentation

## 2011-11-24 DIAGNOSIS — R05 Cough: Secondary | ICD-10-CM | POA: Insufficient documentation

## 2011-11-24 DIAGNOSIS — R059 Cough, unspecified: Secondary | ICD-10-CM | POA: Insufficient documentation

## 2011-11-25 ENCOUNTER — Encounter (HOSPITAL_COMMUNITY)
Admission: RE | Admit: 2011-11-25 | Discharge: 2011-11-25 | Disposition: A | Discharge status: Home / Self Care | Payer: Managed Care, Other (non HMO) | Source: Ambulatory Visit | Attending: Cardiovascular Disease | Admitting: Cardiovascular Disease

## 2011-11-27 ENCOUNTER — Encounter (HOSPITAL_COMMUNITY)
Admission: RE | Admit: 2011-11-27 | Discharge: 2011-11-27 | Disposition: A | Discharge status: Home / Self Care | Payer: Managed Care, Other (non HMO) | Source: Ambulatory Visit | Attending: Cardiovascular Disease | Admitting: Cardiovascular Disease

## 2011-11-30 ENCOUNTER — Encounter (HOSPITAL_COMMUNITY): Payer: Managed Care, Other (non HMO)

## 2011-11-30 NOTE — Progress Notes (Signed)
Patient decided that he could not continue Cardiac Rehab due to having to return to work. Cardiac Rehab was making him late for work so he had to stop.

## 2011-12-02 ENCOUNTER — Encounter (HOSPITAL_COMMUNITY): Payer: Managed Care, Other (non HMO)

## 2011-12-04 ENCOUNTER — Encounter (HOSPITAL_COMMUNITY): Payer: Managed Care, Other (non HMO)

## 2011-12-07 ENCOUNTER — Encounter (HOSPITAL_COMMUNITY): Payer: Managed Care, Other (non HMO)

## 2011-12-09 ENCOUNTER — Encounter (HOSPITAL_COMMUNITY): Payer: Managed Care, Other (non HMO)

## 2011-12-11 ENCOUNTER — Encounter (HOSPITAL_COMMUNITY): Payer: Managed Care, Other (non HMO)

## 2011-12-14 ENCOUNTER — Encounter (HOSPITAL_COMMUNITY): Payer: Managed Care, Other (non HMO)

## 2011-12-16 ENCOUNTER — Encounter (HOSPITAL_COMMUNITY): Payer: Managed Care, Other (non HMO)

## 2011-12-18 ENCOUNTER — Encounter (HOSPITAL_COMMUNITY): Payer: Managed Care, Other (non HMO)

## 2011-12-21 ENCOUNTER — Encounter (HOSPITAL_COMMUNITY): Payer: Managed Care, Other (non HMO)

## 2011-12-23 ENCOUNTER — Encounter (HOSPITAL_COMMUNITY): Payer: Managed Care, Other (non HMO)

## 2011-12-25 ENCOUNTER — Encounter (HOSPITAL_COMMUNITY): Payer: Managed Care, Other (non HMO)

## 2011-12-28 ENCOUNTER — Encounter (HOSPITAL_COMMUNITY): Payer: Managed Care, Other (non HMO)

## 2011-12-30 ENCOUNTER — Encounter (HOSPITAL_COMMUNITY): Payer: Managed Care, Other (non HMO)

## 2012-01-01 ENCOUNTER — Encounter (HOSPITAL_COMMUNITY): Payer: Managed Care, Other (non HMO)

## 2012-01-04 ENCOUNTER — Encounter (HOSPITAL_COMMUNITY): Payer: Managed Care, Other (non HMO)

## 2012-01-26 NOTE — Patient Instructions (Addendum)
Pt has finished orientation on 10/13/11 and is scheduled to start CR on 10/14/11 at 6:45 am. Pt has been instructed to arrive to class 15 minutes early for scheduled class. Pt has been instructed to wear comfortable clothing and shoes with rubber soles. Pt has been told to take their medications 1 hour prior to coming to class.  If the patient is not going to attend class, he/she has been instructed to call.

## 2012-02-05 NOTE — Progress Notes (Signed)
Cardiac Rehabilitation Program Outcomes Report   Orientation:  10/13/2011 1st week report: 10/21/2011  Graduate Date:  tbd Discharge Date:  tbd # of sessions completed: 3 DX: Coronary Artery  Bypass Graft  Cardiologist: Croitoru Family MD:  Betsy Pries Time:  06:45  A.  Exercise Program:  Tolerates exercise @ 2.9 METS for 15 minutes and Walk Test Results:  Pre: Walked 6 minutes Rest HR is66, BP is 110/82, O2 93and RPE 9 and RPD 9. Post HRwas 61, BP 92/60, O2 98 and RPE 9 and RPD 9.  B.  Mental Health:  Good mental attitude  C.  Education/Instruction/Skills  Knows THR for exercise and Uses Perceived Exertion Scale and/or Dyspnea Scale  Uses Perceived Exertion Scale and/or Dyspnea Scale  D.  Nutrition/Weight Control/Body Composition:  Adherence to prescribed nutrition program: good    E.  Blood Lipids    Lab Results  Component Value Date   CHOL  Value: 191        ATP III CLASSIFICATION:  <200     mg/dL   Desirable  161-096  mg/dL   Borderline High  >=045    mg/dL   High        06/08/8117   HDL 35* 06/18/2010   LDLCALC  Value: 109        Total Cholesterol/HDL:CHD Risk Coronary Heart Disease Risk Table                     Men   Women  1/2 Average Risk   3.4   3.3  Average Risk       5.0   4.4  2 X Average Risk   9.6   7.1  3 X Average Risk  23.4   11.0        Use the calculated Patient Ratio above and the CHD Risk Table to determine the patient's CHD Risk.        ATP III CLASSIFICATION (LDL):  <100     mg/dL   Optimal  147-829  mg/dL   Near or Above                    Optimal  130-159  mg/dL   Borderline  562-130  mg/dL   High  >865     mg/dL   Very High* 7/84/6962   TRIG 234* 06/18/2010   CHOLHDL 5.5 06/18/2010    F.  Lifestyle Changes:  Making positive lifestyle changes  G.  Symptoms noted with exercise:  Asymptomatic  Report Completed By:  [name]. [name] Shackleford RN   Comments:   This is patients 1st week report. He has done well. He achieved a Peak METS  of 2.90. His resting HR was  63 and His resting BP was 112/62, His peak HR was 113 and peak BP was 132/80.  A report will follow on patients [place] reoprt or 18 th session.

## 2012-02-08 NOTE — Progress Notes (Signed)
Cardiac Rehabilitation Program Outcomes Report   Orientation:  10/13/2011 Graduate Date:  Did not graduate Discharge Date:  11/27/2011 # of sessions completed: 12 DX: CAD CABG X 4  Cardiologist: Croitoru Family MD:  Betsy Pries Time:  06;45  A.  Exercise Program:  Tolerates exercise @ 2.70 METS for 15 minutes and Discharged  B.  Mental Health:  Good mental attitude, Health related anxiety and Significant job stress  C.  Education/Instruction/Skills  Knows THR for exercise, Uses Perceived Exertion Scale and/or Dyspnea Scale and Attended 4 education classes  Uses Perceived Exertion Scale and/or Dyspnea Scale  D.  Nutrition/Weight Control/Body Composition:  Adherence to prescribed nutrition program: good , There is no height or weight on file to calculate BMI., % Body Fat   31.6% and Patient has lost 3.1 kg   E.  Blood Lipids    Lab Results  Component Value Date   CHOL  Value: 191        ATP III CLASSIFICATION:  <200     mg/dL   Desirable  161-096  mg/dL   Borderline High  >=045    mg/dL   High        06/08/8117   HDL 35* 06/18/2010   LDLCALC  Value: 109        Total Cholesterol/HDL:CHD Risk Coronary Heart Disease Risk Table                     Men   Women  1/2 Average Risk   3.4   3.3  Average Risk       5.0   4.4  2 X Average Risk   9.6   7.1  3 X Average Risk  23.4   11.0        Use the calculated Patient Ratio above and the CHD Risk Table to determine the patient's CHD Risk.        ATP III CLASSIFICATION (LDL):  <100     mg/dL   Optimal  147-829  mg/dL   Near or Above                    Optimal  130-159  mg/dL   Borderline  562-130  mg/dL   High  >865     mg/dL   Very High* 7/84/6962   TRIG 234* 06/18/2010   CHOLHDL 5.5 06/18/2010    F.  Lifestyle Changes:  Making positive lifestyle changes and Not smoking:  Quit 1/1 1987  G.  Symptoms noted with exercise:  Asymptomatic  Report Completed By:  Lelon Huh. Eliza Green RN   Comments:  Patient  Attended 12  sessions of cardiac rehab, Stopped due to his work. His resting HR was 78 and resting BP was 128/78.  His peak HR was 111 and his  Peak BP was 140/80. He was doing well while in rehab.

## 2012-09-09 IMAGING — CR DG ABD PORTABLE 1V
2 series · 2 of 2 positions shown · non-contrast
Comparison: 06/18/2010.

CLINICAL DATA: Abdominal pain.

ABDOMEN - 1 VIEW

[AP (1 of 2)]
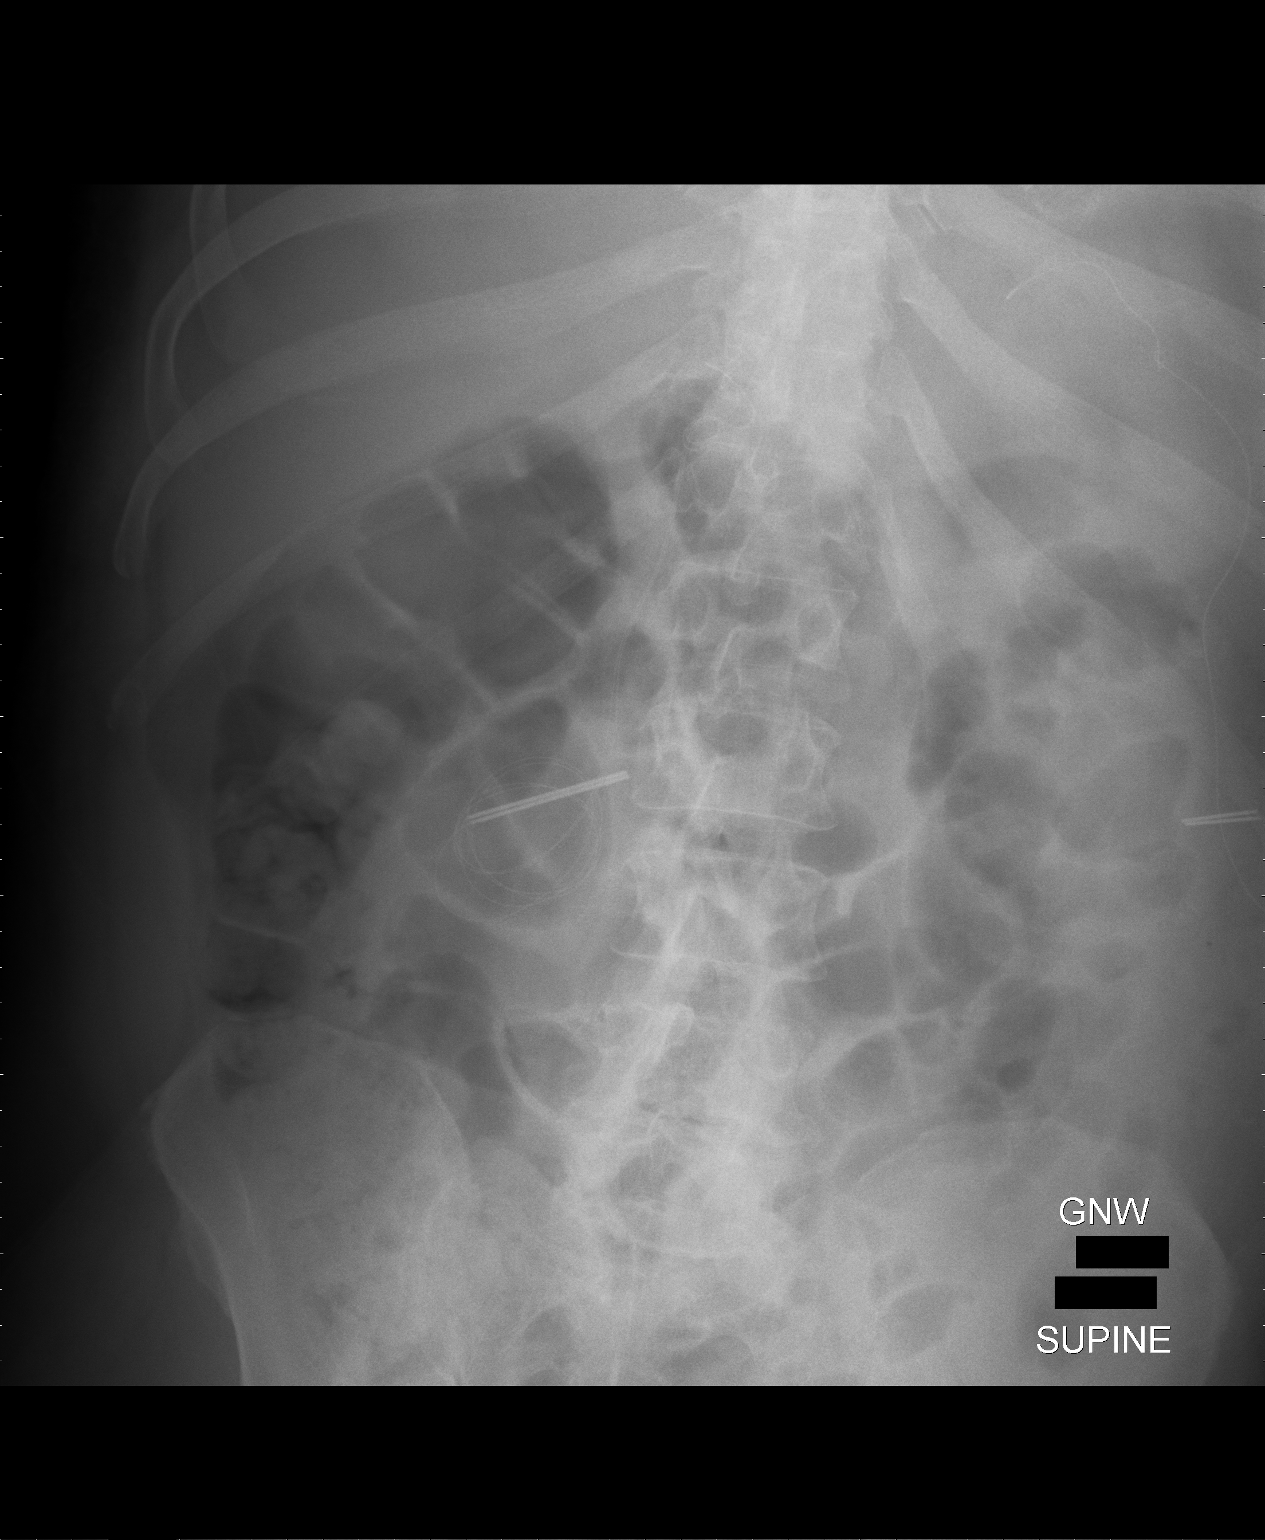

[AP (2 of 2)]
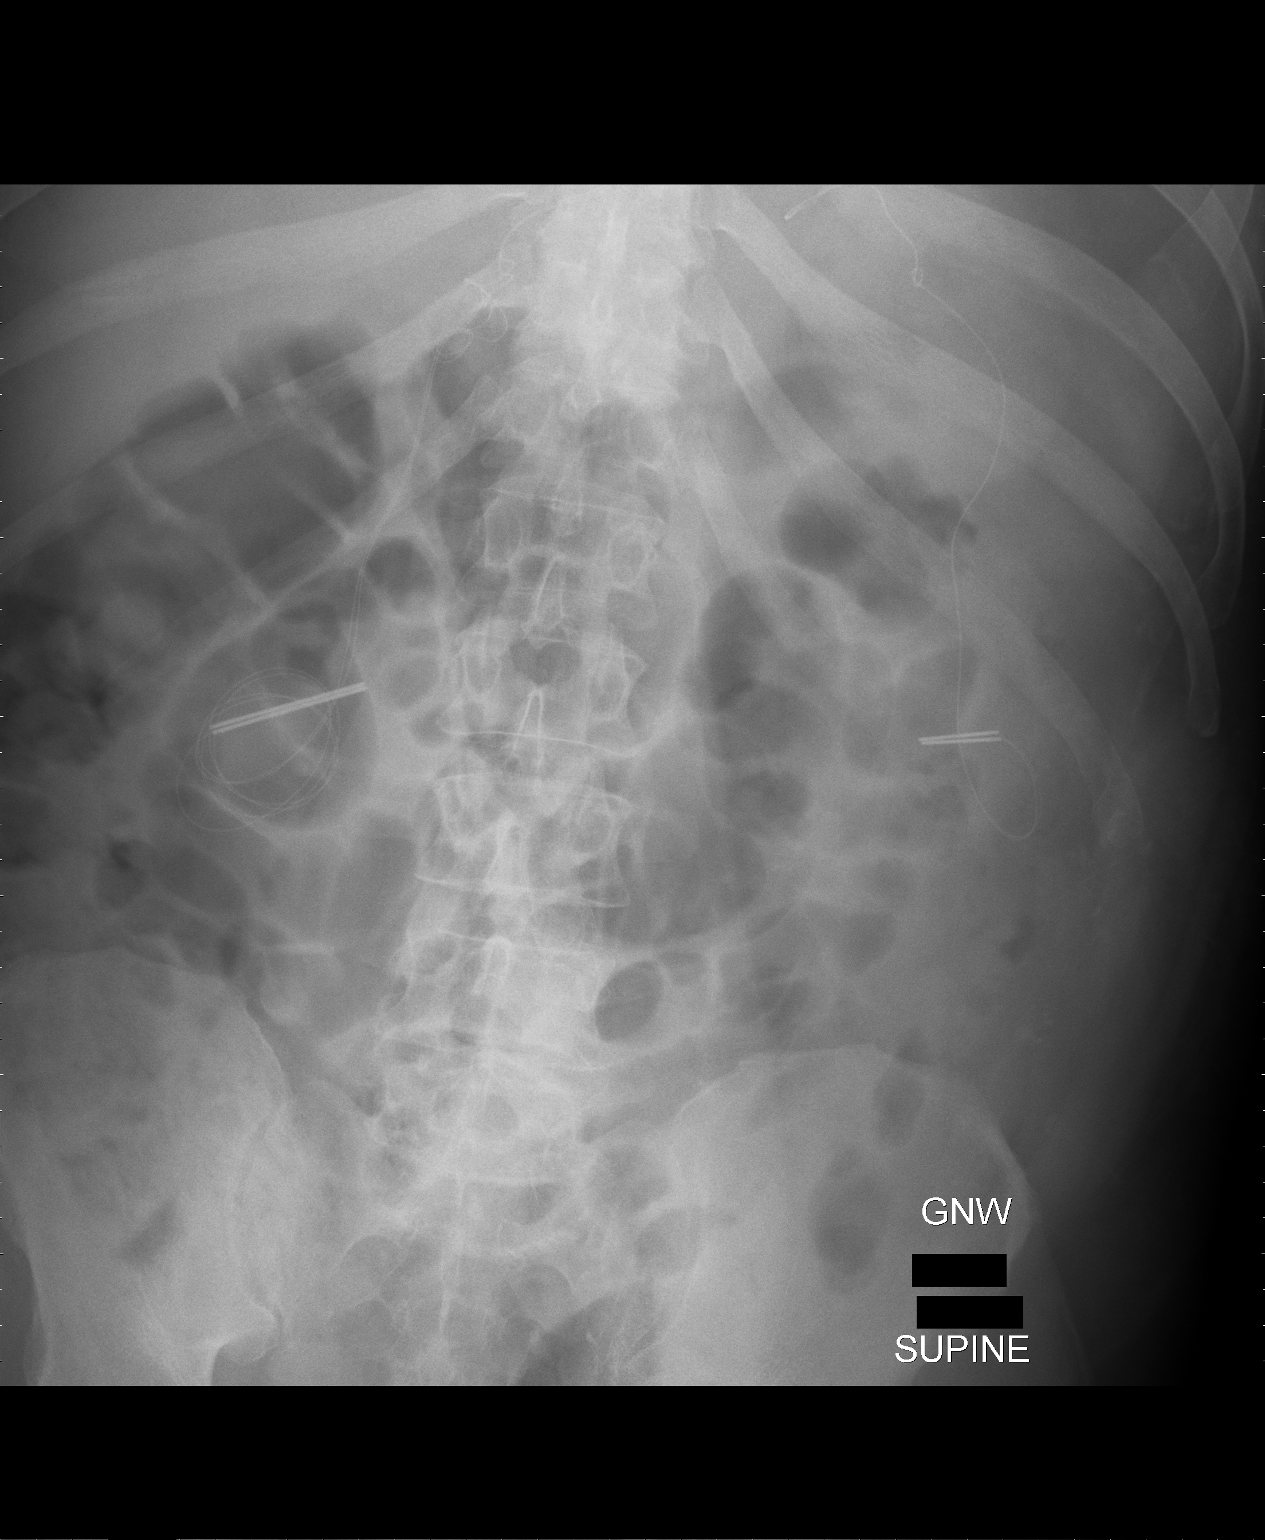

[2 of 2 positions shown; findings below may reference images not displayed]

FINDINGS: The bowel gas pattern is nonobstructive.  Epicardial
pacing leads overlie the upper abdomen bilaterally status post
median sternotomy.  There is no supine evidence of free
intraperitoneal air.
IMPRESSION: No acute abdominal findings.

## 2012-09-09 IMAGING — CR DG CHEST 1V PORT
2 series · 2 of 2 positions shown · non-contrast
Comparison: Yesterday

CLINICAL DATA: Chest tube removal

PORTABLE CHEST - 1 VIEW

[AP (1 of 2)]
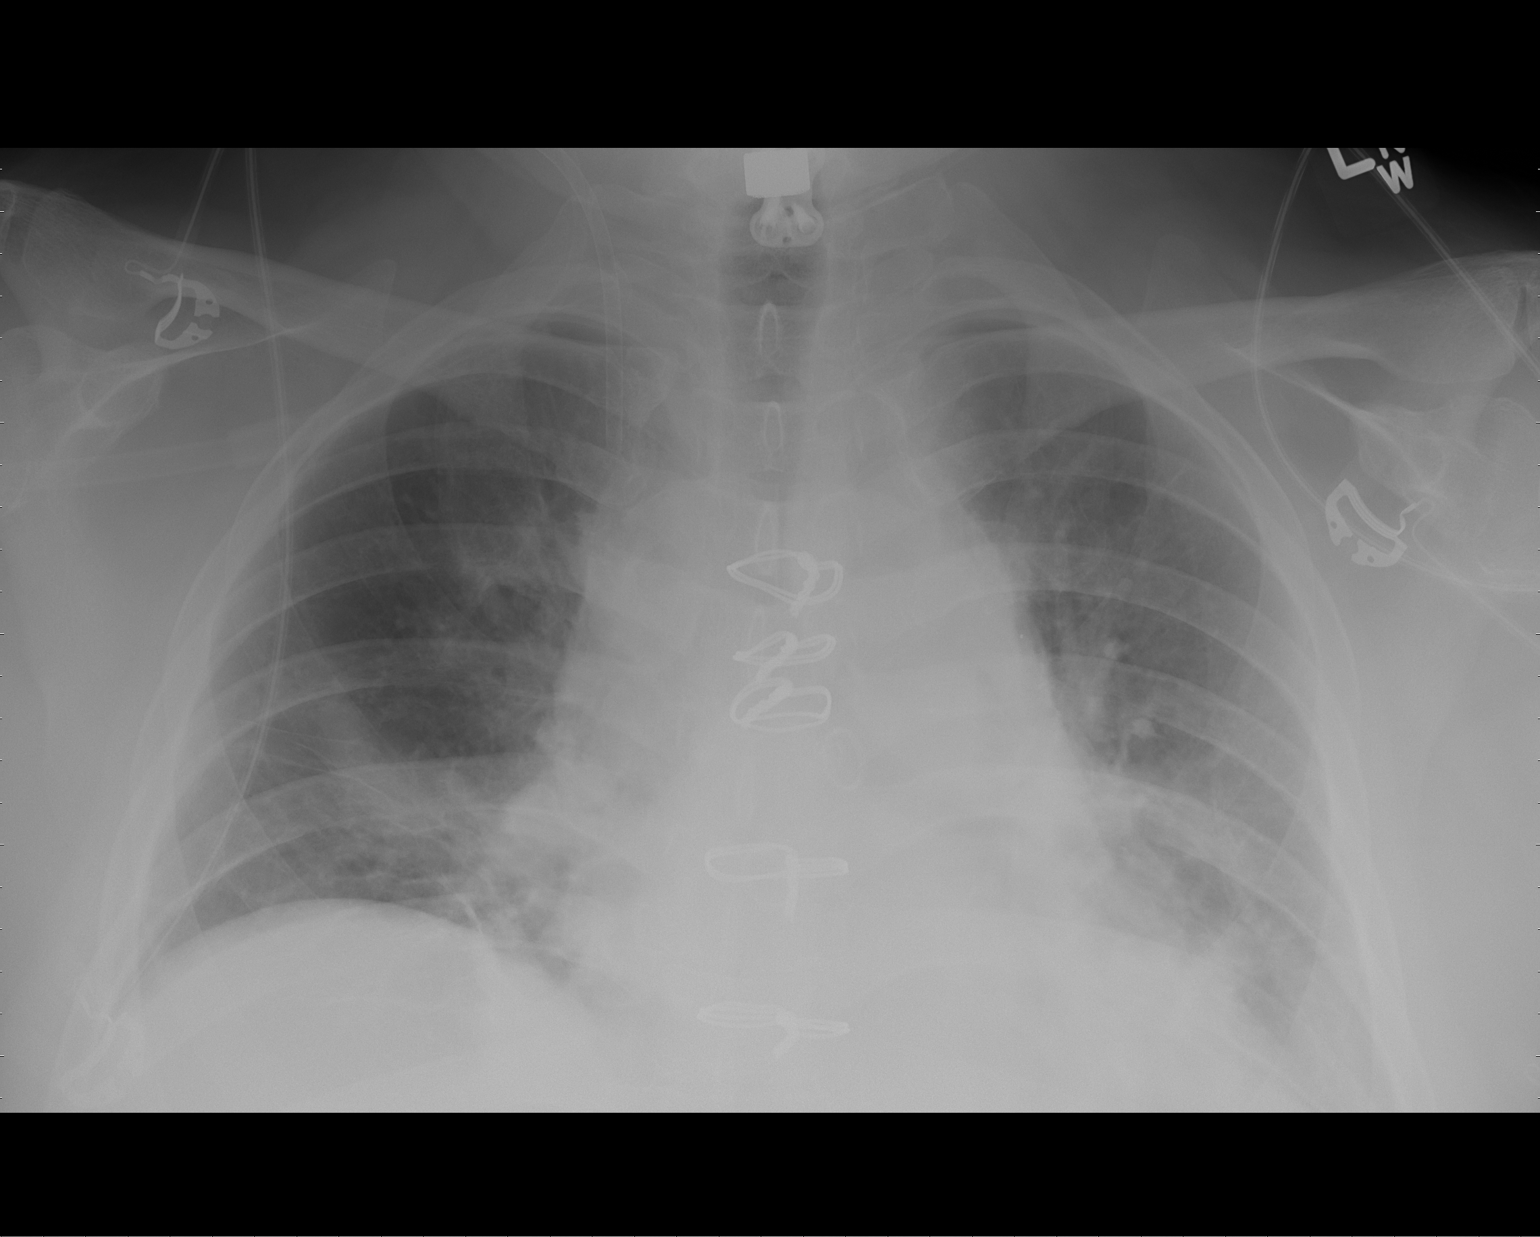

[AP (2 of 2)]
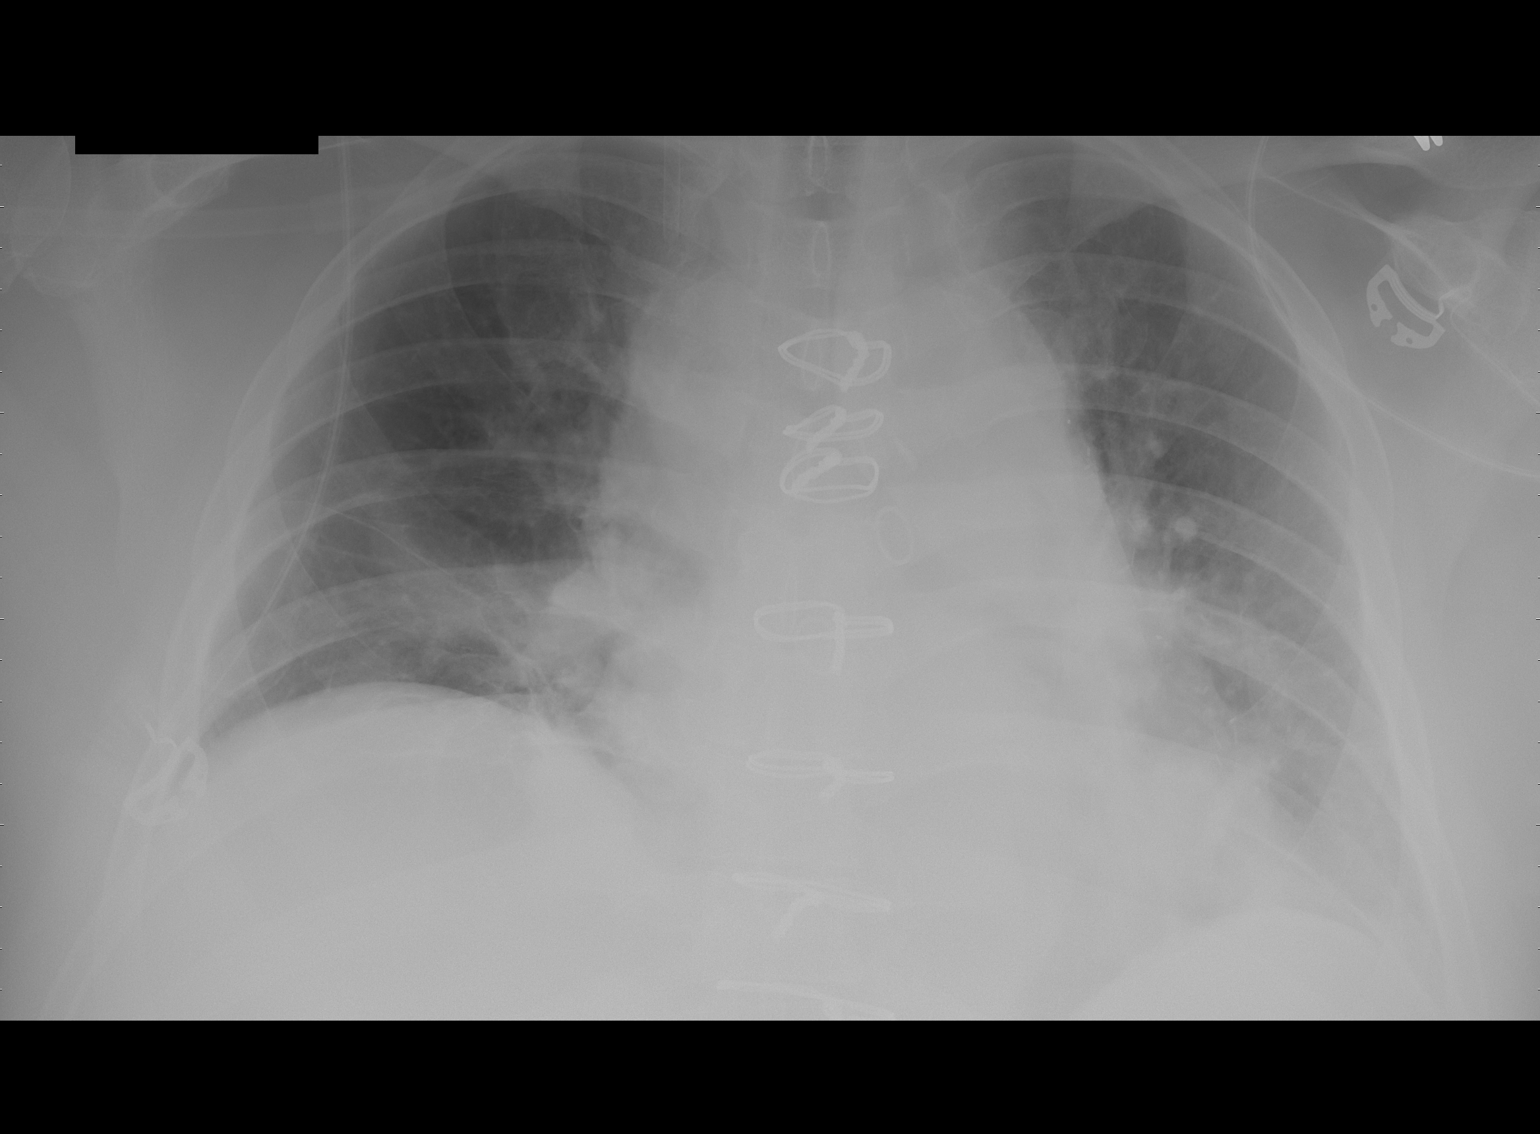

[2 of 2 positions shown; findings below may reference images not displayed]

FINDINGS: Left-sided chest tubes have been removed.  Left basilar
consolidation versus atelectasis is stable.  Patchy density at the
right base is noted.  Better aeration.  Gabriel has been removed
with the introducer sheath left in place.  No pneumothorax.
IMPRESSION: Pulmonary opacity persists at both bases but there is better
aeration.  Chest tube removal without pneumothorax

## 2012-10-01 IMAGING — CR DG CHEST 2V
2 series · 2 of 2 positions shown · non-contrast
Comparison: 06/23/2010

CLINICAL DATA: Follow-up CABG

CHEST - 2 VIEW

[w chest pa]
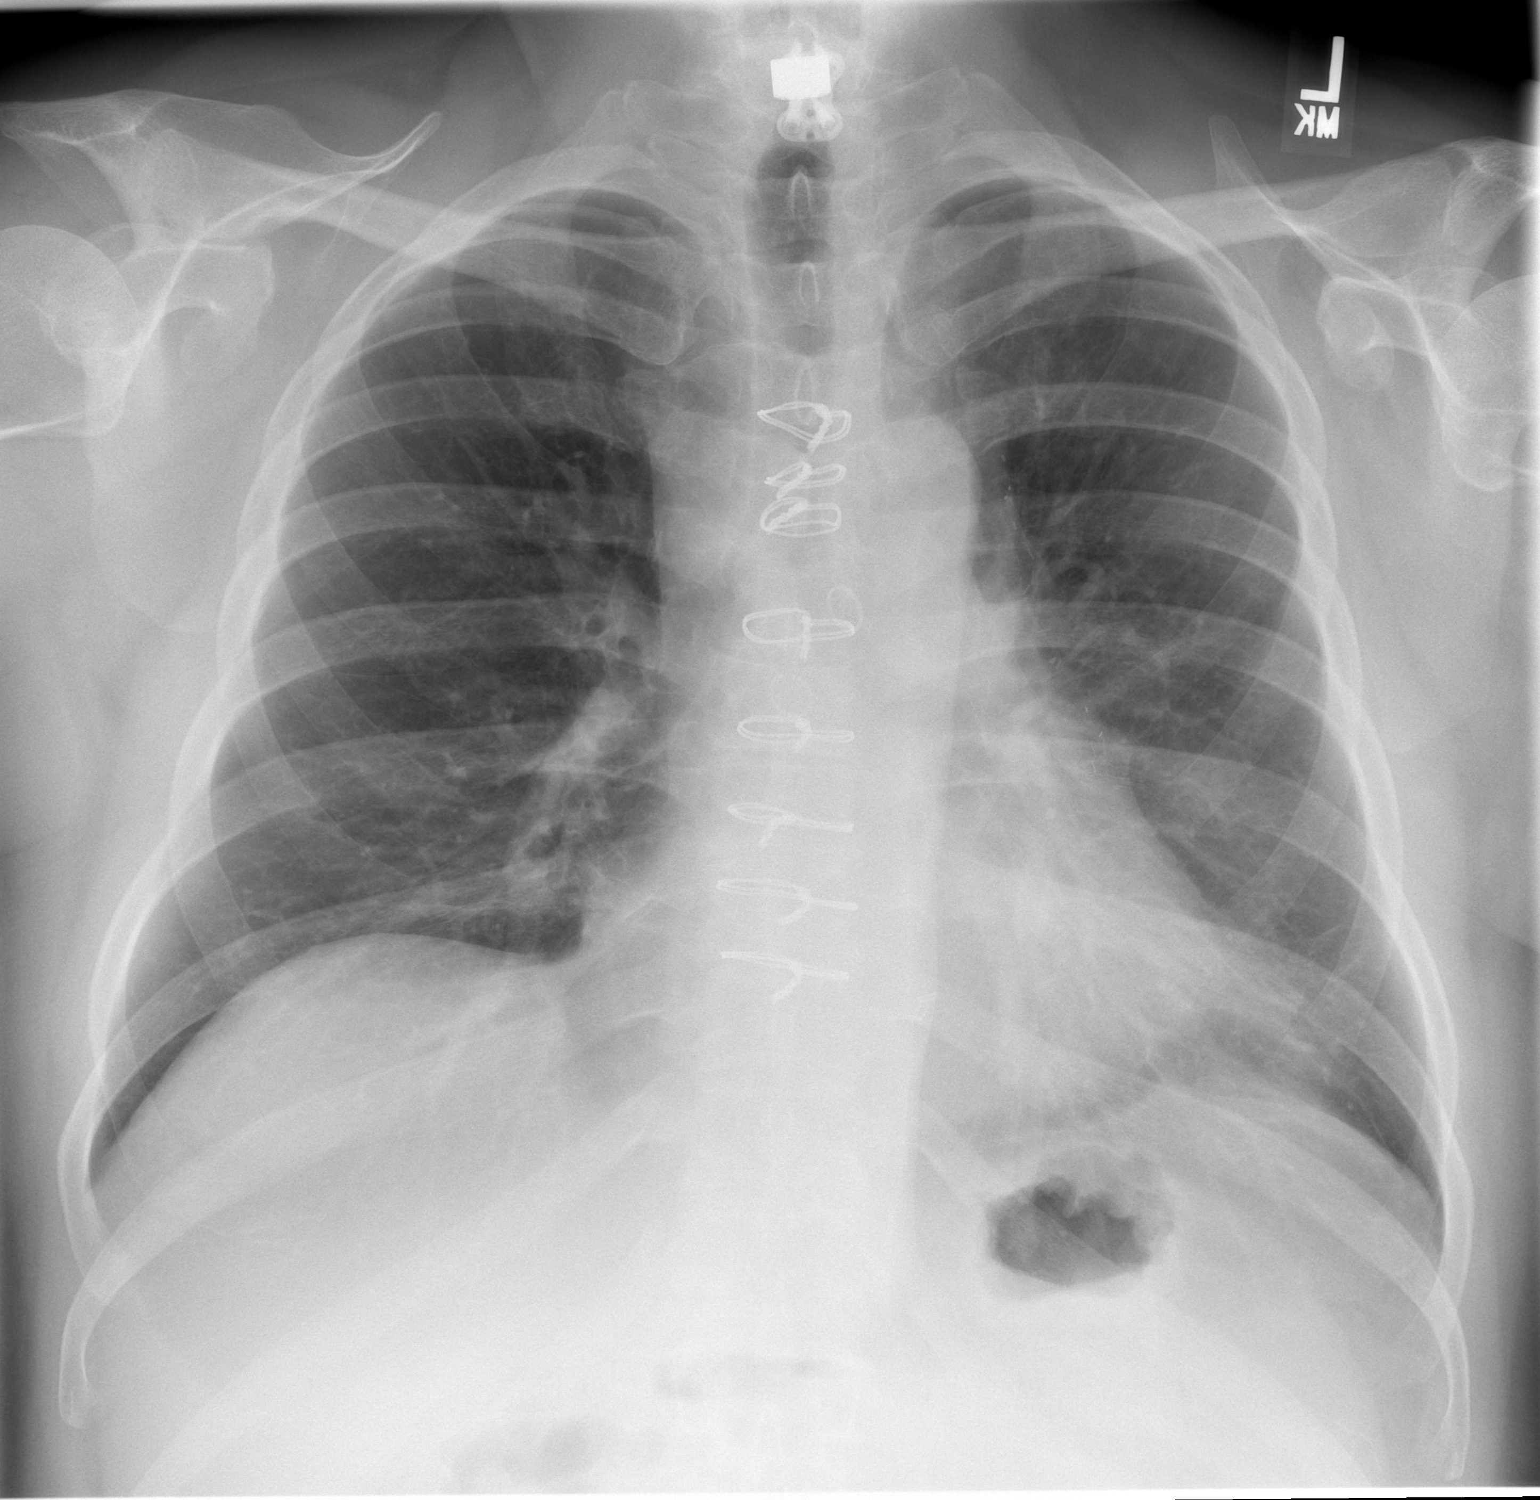

[w chest lat]
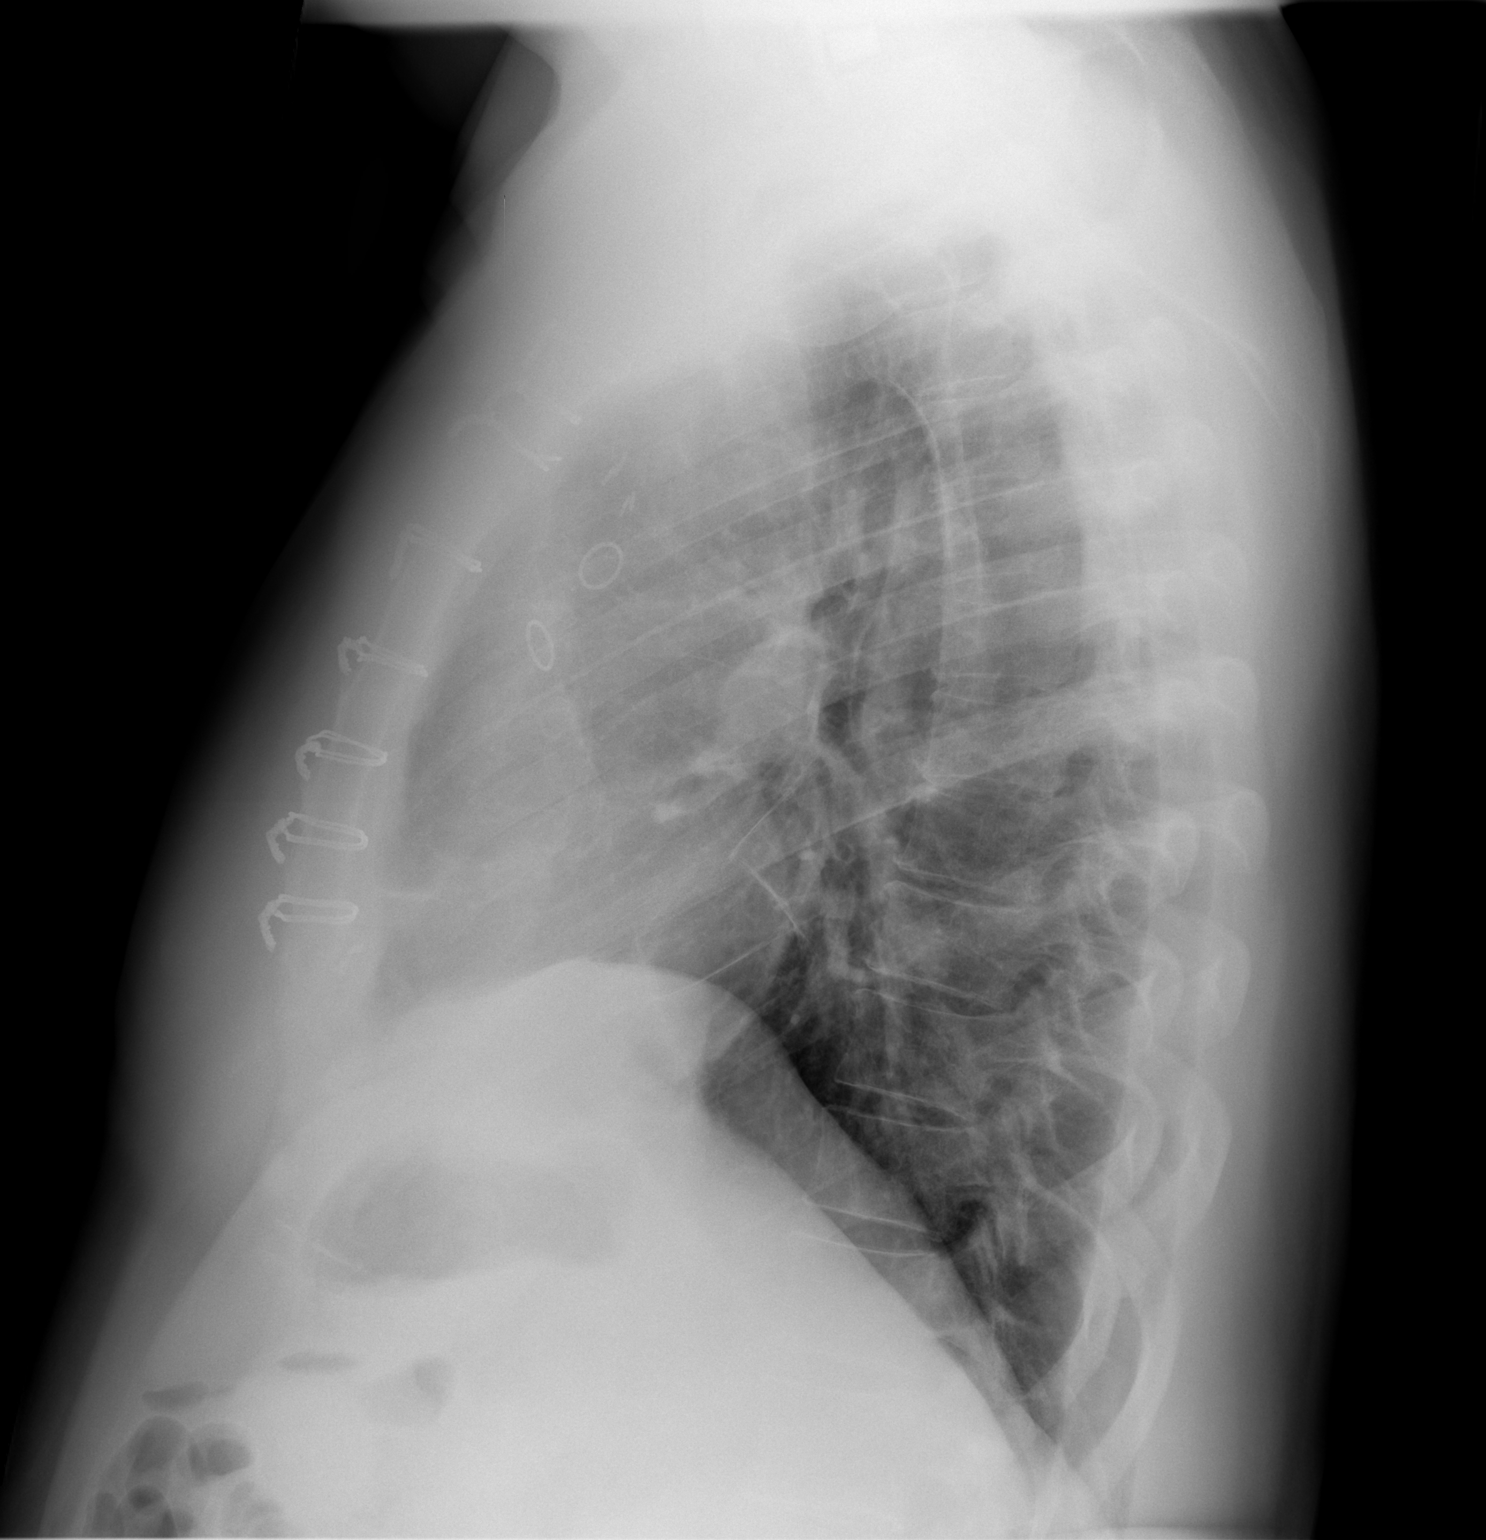

[2 of 2 positions shown; findings below may reference images not displayed]

FINDINGS: There is been previous median sternotomy and CABG.
Mediastinal shadows show a normal postoperative appearance.  The
lungs are clear.  No effusions.  The vascularity is normal.  No
significant bony finding.
IMPRESSION: Normal appearance following previous CABG.  Lungs clear.  No
effusions.

## 2013-05-15 DIAGNOSIS — G4733 Obstructive sleep apnea (adult) (pediatric): Secondary | ICD-10-CM | POA: Insufficient documentation

## 2013-11-14 ENCOUNTER — Telehealth: Payer: Self-pay | Admitting: Cardiovascular Disease

## 2013-11-17 NOTE — Telephone Encounter (Signed)
Closed encounter °

## 2013-12-20 ENCOUNTER — Ambulatory Visit: Payer: Managed Care, Other (non HMO) | Admitting: Cardiovascular Disease

## 2013-12-25 ENCOUNTER — Ambulatory Visit: Payer: Managed Care, Other (non HMO) | Admitting: Cardiovascular Disease

## 2015-04-29 DIAGNOSIS — H3582 Retinal ischemia: Secondary | ICD-10-CM | POA: Insufficient documentation

## 2017-05-27 ENCOUNTER — Ambulatory Visit
Admit: 2017-05-27 | Discharge: 2017-05-27 | Disposition: A | Discharge status: Home / Self Care | Payer: PRIVATE HEALTH INSURANCE | Attending: Emergency Medicine

## 2017-05-27 ENCOUNTER — Emergency Department
Admit: 2017-05-27 | Discharge: 2017-05-27 | Disposition: A | Discharge status: Home / Self Care | Payer: PRIVATE HEALTH INSURANCE | Attending: Emergency Medicine

## 2017-05-27 DIAGNOSIS — R42 Dizziness and giddiness: Principal | ICD-10-CM

## 2017-07-07 ENCOUNTER — Ambulatory Visit
Admit: 2017-07-07 | Discharge: 2017-07-08 | Payer: PRIVATE HEALTH INSURANCE | Attending: Physician Assistant | Primary: Physician Assistant

## 2017-07-07 DIAGNOSIS — G4730 Sleep apnea, unspecified: Principal | ICD-10-CM

## 2017-07-07 DIAGNOSIS — I252 Old myocardial infarction: Secondary | ICD-10-CM

## 2017-07-13 ENCOUNTER — Ambulatory Visit: Admit: 2017-07-13 | Discharge: 2017-07-14 | Payer: PRIVATE HEALTH INSURANCE | Attending: Family | Primary: Family

## 2017-07-13 DIAGNOSIS — G4733 Obstructive sleep apnea (adult) (pediatric): Principal | ICD-10-CM

## 2017-07-22 ENCOUNTER — Ambulatory Visit: Admit: 2017-07-22 | Discharge: 2017-07-24 | Payer: PRIVATE HEALTH INSURANCE

## 2017-07-22 DIAGNOSIS — G4733 Obstructive sleep apnea (adult) (pediatric): Principal | ICD-10-CM

## 2017-08-10 ENCOUNTER — Institutional Professional Consult (permissible substitution)
Admit: 2017-08-10 | Discharge: 2017-09-08 | Payer: PRIVATE HEALTH INSURANCE | Attending: Registered" | Primary: Registered"

## 2017-08-10 ENCOUNTER — Institutional Professional Consult (permissible substitution): Admit: 2017-08-10 | Discharge: 2017-09-08 | Payer: PRIVATE HEALTH INSURANCE

## 2017-08-10 ENCOUNTER — Institutional Professional Consult (permissible substitution)
Admit: 2017-08-10 | Discharge: 2017-09-08 | Payer: PRIVATE HEALTH INSURANCE | Attending: "Psychiatric/Mental Health | Primary: "Psychiatric/Mental Health

## 2017-08-10 ENCOUNTER — Ambulatory Visit: Admit: 2017-08-10 | Discharge: 2017-09-08 | Payer: PRIVATE HEALTH INSURANCE

## 2017-08-10 DIAGNOSIS — Z955 Presence of coronary angioplasty implant and graft: Principal | ICD-10-CM

## 2017-08-25 DIAGNOSIS — Z955 Presence of coronary angioplasty implant and graft: Principal | ICD-10-CM

## 2017-08-27 DIAGNOSIS — Z955 Presence of coronary angioplasty implant and graft: Principal | ICD-10-CM

## 2017-09-06 DIAGNOSIS — Z955 Presence of coronary angioplasty implant and graft: Principal | ICD-10-CM

## 2017-09-08 DIAGNOSIS — Z955 Presence of coronary angioplasty implant and graft: Principal | ICD-10-CM

## 2017-09-10 ENCOUNTER — Institutional Professional Consult (permissible substitution)
Admit: 2017-09-10 | Discharge: 2017-10-08 | Payer: PRIVATE HEALTH INSURANCE | Attending: Registered" | Primary: Registered"

## 2017-09-10 ENCOUNTER — Institutional Professional Consult (permissible substitution): Admit: 2017-09-10 | Discharge: 2017-10-08 | Payer: PRIVATE HEALTH INSURANCE

## 2017-09-10 ENCOUNTER — Ambulatory Visit: Admit: 2017-09-10 | Discharge: 2017-10-08 | Payer: PRIVATE HEALTH INSURANCE

## 2017-09-10 ENCOUNTER — Ambulatory Visit: Admit: 2017-09-10 | Discharge: 2017-09-12 | Payer: PRIVATE HEALTH INSURANCE

## 2017-09-10 DIAGNOSIS — G4733 Obstructive sleep apnea (adult) (pediatric): Principal | ICD-10-CM

## 2017-09-10 DIAGNOSIS — Z955 Presence of coronary angioplasty implant and graft: Principal | ICD-10-CM

## 2017-09-13 DIAGNOSIS — Z955 Presence of coronary angioplasty implant and graft: Principal | ICD-10-CM

## 2017-09-15 DIAGNOSIS — Z955 Presence of coronary angioplasty implant and graft: Principal | ICD-10-CM

## 2017-09-17 DIAGNOSIS — Z955 Presence of coronary angioplasty implant and graft: Principal | ICD-10-CM

## 2017-09-20 DIAGNOSIS — Z955 Presence of coronary angioplasty implant and graft: Principal | ICD-10-CM

## 2017-09-22 DIAGNOSIS — Z955 Presence of coronary angioplasty implant and graft: Principal | ICD-10-CM

## 2017-09-27 DIAGNOSIS — Z955 Presence of coronary angioplasty implant and graft: Principal | ICD-10-CM

## 2017-09-28 ENCOUNTER — Ambulatory Visit: Admit: 2017-09-28 | Discharge: 2017-09-29 | Payer: PRIVATE HEALTH INSURANCE

## 2017-09-28 DIAGNOSIS — M25561 Pain in right knee: Principal | ICD-10-CM

## 2017-09-28 DIAGNOSIS — G8929 Other chronic pain: Secondary | ICD-10-CM

## 2017-09-28 DIAGNOSIS — M25562 Pain in left knee: Secondary | ICD-10-CM

## 2017-09-29 DIAGNOSIS — Z955 Presence of coronary angioplasty implant and graft: Principal | ICD-10-CM

## 2017-10-19 ENCOUNTER — Ambulatory Visit: Admit: 2017-10-19 | Discharge: 2017-10-20 | Payer: PRIVATE HEALTH INSURANCE

## 2017-10-19 DIAGNOSIS — M170 Bilateral primary osteoarthritis of knee: Principal | ICD-10-CM

## 2017-10-26 ENCOUNTER — Ambulatory Visit: Admit: 2017-10-26 | Discharge: 2017-10-27 | Payer: PRIVATE HEALTH INSURANCE

## 2017-10-26 DIAGNOSIS — M170 Bilateral primary osteoarthritis of knee: Principal | ICD-10-CM

## 2018-02-16 ENCOUNTER — Ambulatory Visit: Admit: 2018-02-16 | Discharge: 2018-02-17 | Payer: PRIVATE HEALTH INSURANCE

## 2018-02-16 DIAGNOSIS — J309 Allergic rhinitis, unspecified: Secondary | ICD-10-CM

## 2018-02-16 DIAGNOSIS — J310 Chronic rhinitis: Principal | ICD-10-CM

## 2018-02-16 DIAGNOSIS — G4730 Sleep apnea, unspecified: Secondary | ICD-10-CM

## 2018-02-16 MED ORDER — FLUTICASONE PROPIONATE 50 MCG/ACTUATION NASAL SPRAY,SUSPENSION
Freq: Two times a day (BID) | NASAL | 12 refills | 0.00000 days | Status: CP
Start: 2018-02-16 — End: 2019-02-16

## 2018-05-17 ENCOUNTER — Ambulatory Visit: Admit: 2018-05-17 | Discharge: 2018-05-18 | Payer: PRIVATE HEALTH INSURANCE

## 2018-05-17 DIAGNOSIS — Z1383 Encounter for screening for respiratory disorder NEC: Principal | ICD-10-CM

## 2018-09-16 ENCOUNTER — Ambulatory Visit: Admit: 2018-09-16 | Discharge: 2018-09-16 | Discharge status: Against Medical Advice | Payer: PRIVATE HEALTH INSURANCE

## 2018-09-16 ENCOUNTER — Telehealth
Admit: 2018-09-16 | Discharge: 2018-09-17 | Payer: PRIVATE HEALTH INSURANCE | Attending: Family Medicine | Primary: Family Medicine

## 2018-09-16 DIAGNOSIS — K921 Melena: Principal | ICD-10-CM

## 2018-09-16 DIAGNOSIS — R5383 Other fatigue: Secondary | ICD-10-CM

## 2018-11-24 ENCOUNTER — Ambulatory Visit: Admit: 2018-11-24 | Discharge: 2018-11-24 | Payer: PRIVATE HEALTH INSURANCE

## 2018-11-24 ENCOUNTER — Ambulatory Visit: Admit: 2018-11-24 | Discharge: 2018-11-24 | Payer: PRIVATE HEALTH INSURANCE | Attending: Medical | Primary: Medical

## 2018-11-24 DIAGNOSIS — R5383 Other fatigue: Secondary | ICD-10-CM

## 2018-11-24 DIAGNOSIS — Z1211 Encounter for screening for malignant neoplasm of colon: Secondary | ICD-10-CM

## 2018-11-24 DIAGNOSIS — Z1159 Encounter for screening for other viral diseases: Secondary | ICD-10-CM

## 2018-11-24 DIAGNOSIS — Z8719 Personal history of other diseases of the digestive system: Secondary | ICD-10-CM

## 2018-11-24 DIAGNOSIS — I10 Essential (primary) hypertension: Secondary | ICD-10-CM

## 2018-11-24 DIAGNOSIS — Z6839 Body mass index (BMI) 39.0-39.9, adult: Secondary | ICD-10-CM

## 2018-11-24 DIAGNOSIS — E785 Hyperlipidemia, unspecified: Secondary | ICD-10-CM

## 2018-11-24 DIAGNOSIS — E291 Testicular hypofunction: Secondary | ICD-10-CM

## 2018-11-24 DIAGNOSIS — E1169 Type 2 diabetes mellitus with other specified complication: Secondary | ICD-10-CM

## 2018-11-24 DIAGNOSIS — I25119 Atherosclerotic heart disease of native coronary artery with unspecified angina pectoris: Secondary | ICD-10-CM

## 2018-11-24 DIAGNOSIS — G4733 Obstructive sleep apnea (adult) (pediatric): Secondary | ICD-10-CM

## 2018-11-24 DIAGNOSIS — Z7689 Persons encountering health services in other specified circumstances: Secondary | ICD-10-CM

## 2018-11-24 DIAGNOSIS — Z9049 Acquired absence of other specified parts of digestive tract: Secondary | ICD-10-CM

## 2018-11-24 DIAGNOSIS — Z23 Encounter for immunization: Secondary | ICD-10-CM

## 2018-11-25 DIAGNOSIS — E119 Type 2 diabetes mellitus without complications: Secondary | ICD-10-CM | POA: Insufficient documentation

## 2018-11-25 DIAGNOSIS — R5383 Other fatigue: Secondary | ICD-10-CM | POA: Insufficient documentation

## 2018-11-25 DIAGNOSIS — E291 Testicular hypofunction: Secondary | ICD-10-CM | POA: Insufficient documentation

## 2019-01-12 MED ORDER — PEG-ELECTROLYTE SOLUTION 420 GRAM ORAL SOLUTION: 4000 mL | mL | 0 refills | 1 days | Status: AC

## 2019-01-12 MED ORDER — PEG-ELECTROLYTE SOLUTION 420 GRAM ORAL SOLUTION
ORAL | 0 refills | 1.00000 days | Status: CP
Start: 2019-01-12 — End: 2019-01-12

## 2019-01-12 MED ORDER — ATORVASTATIN 40 MG TABLET
ORAL_TABLET | Freq: Every day | ORAL | 3 refills | 90.00000 days | Status: CP
Start: 2019-01-12 — End: ?

## 2019-01-12 MED FILL — PEG-ELECTROLYTE SOLUTION 420 GRAM ORAL SOLUTION: 1 days supply | Qty: 4000 | Fill #0

## 2019-01-12 MED FILL — PEG-ELECTROLYTE SOLUTION 420 GRAM ORAL SOLUTION: 1 days supply | Qty: 4000 | Fill #0 | Status: AC

## 2019-02-16 ENCOUNTER — Ambulatory Visit: Admit: 2019-02-16 | Discharge: 2019-02-17 | Payer: PRIVATE HEALTH INSURANCE | Attending: Medical | Primary: Medical

## 2019-02-16 DIAGNOSIS — Z6841 Body Mass Index (BMI) 40.0 and over, adult: Principal | ICD-10-CM

## 2019-02-16 DIAGNOSIS — G4733 Obstructive sleep apnea (adult) (pediatric): Principal | ICD-10-CM

## 2019-02-16 DIAGNOSIS — I10 Essential (primary) hypertension: Principal | ICD-10-CM

## 2019-02-16 DIAGNOSIS — Z23 Encounter for immunization: Principal | ICD-10-CM

## 2019-02-16 DIAGNOSIS — E291 Testicular hypofunction: Principal | ICD-10-CM

## 2019-02-16 DIAGNOSIS — Z0000 Encounter for general adult medical examination without abnormal findings: Principal | ICD-10-CM

## 2019-02-16 DIAGNOSIS — E1169 Type 2 diabetes mellitus with other specified complication: Principal | ICD-10-CM

## 2019-02-16 DIAGNOSIS — I25119 Atherosclerotic heart disease of native coronary artery with unspecified angina pectoris: Principal | ICD-10-CM

## 2019-05-15 ENCOUNTER — Other Ambulatory Visit: Admit: 2019-05-15 | Discharge: 2019-05-16 | Payer: PRIVATE HEALTH INSURANCE

## 2019-05-18 ENCOUNTER — Ambulatory Visit: Admit: 2019-05-18 | Discharge: 2019-05-19 | Payer: PRIVATE HEALTH INSURANCE | Attending: Medical | Primary: Medical

## 2019-05-18 DIAGNOSIS — E785 Hyperlipidemia, unspecified: Principal | ICD-10-CM

## 2019-05-18 DIAGNOSIS — I25119 Atherosclerotic heart disease of native coronary artery with unspecified angina pectoris: Principal | ICD-10-CM

## 2019-05-18 DIAGNOSIS — Z1211 Encounter for screening for malignant neoplasm of colon: Principal | ICD-10-CM

## 2019-05-18 DIAGNOSIS — Z6841 Body Mass Index (BMI) 40.0 and over, adult: Principal | ICD-10-CM

## 2019-05-18 DIAGNOSIS — G4733 Obstructive sleep apnea (adult) (pediatric): Principal | ICD-10-CM

## 2019-05-18 DIAGNOSIS — E291 Testicular hypofunction: Principal | ICD-10-CM

## 2019-05-18 DIAGNOSIS — E1169 Type 2 diabetes mellitus with other specified complication: Principal | ICD-10-CM

## 2019-05-18 MED ORDER — BLOOD-GLUCOSE METER KIT WRAPPER
0 refills | 0 days | Status: CP
Start: 2019-05-18 — End: 2020-05-17

## 2019-05-18 MED ORDER — BLOOD GLUCOSE TEST STRIPS
ORAL_STRIP | 1 refills | 0 days | Status: CP
Start: 2019-05-18 — End: ?

## 2019-05-18 MED ORDER — LANCETS
1 refills | 0 days | Status: CP
Start: 2019-05-18 — End: ?

## 2019-05-19 MED ORDER — TESTOSTERONE CYPIONATE 200 MG/ML INTRAMUSCULAR OIL
INTRAMUSCULAR | 1 refills | 84 days | Status: CP
Start: 2019-05-19 — End: 2019-08-17

## 2019-07-04 DIAGNOSIS — E1169 Type 2 diabetes mellitus with other specified complication: Principal | ICD-10-CM

## 2019-07-07 ENCOUNTER — Ambulatory Visit: Admit: 2019-07-07 | Discharge: 2019-07-08

## 2019-07-07 DIAGNOSIS — E1169 Type 2 diabetes mellitus with other specified complication: Principal | ICD-10-CM

## 2019-07-19 ENCOUNTER — Ambulatory Visit
Admit: 2019-07-19 | Discharge: 2019-07-20 | Payer: PRIVATE HEALTH INSURANCE | Attending: Nutritionist | Primary: Nutritionist

## 2019-08-02 ENCOUNTER — Ambulatory Visit
Admit: 2019-08-02 | Discharge: 2019-08-03 | Payer: PRIVATE HEALTH INSURANCE | Attending: Nutritionist | Primary: Nutritionist

## 2019-08-04 ENCOUNTER — Ambulatory Visit
Admit: 2019-08-04 | Discharge: 2019-08-05 | Payer: PRIVATE HEALTH INSURANCE | Attending: Nutritionist | Primary: Nutritionist

## 2019-08-21 ENCOUNTER — Ambulatory Visit: Admit: 2019-08-21 | Discharge: 2019-08-22 | Payer: PRIVATE HEALTH INSURANCE | Attending: Medical | Primary: Medical

## 2019-08-21 DIAGNOSIS — F439 Reaction to severe stress, unspecified: Principal | ICD-10-CM

## 2019-08-21 DIAGNOSIS — E1169 Type 2 diabetes mellitus with other specified complication: Principal | ICD-10-CM

## 2019-08-21 DIAGNOSIS — I10 Essential (primary) hypertension: Principal | ICD-10-CM

## 2019-08-21 DIAGNOSIS — G2581 Restless legs syndrome: Principal | ICD-10-CM

## 2019-08-21 DIAGNOSIS — G4733 Obstructive sleep apnea (adult) (pediatric): Principal | ICD-10-CM

## 2019-08-21 DIAGNOSIS — I25119 Atherosclerotic heart disease of native coronary artery with unspecified angina pectoris: Principal | ICD-10-CM

## 2019-08-21 DIAGNOSIS — E291 Testicular hypofunction: Principal | ICD-10-CM

## 2019-09-18 ENCOUNTER — Ambulatory Visit: Admit: 2019-09-18 | Discharge: 2019-09-19 | Payer: PRIVATE HEALTH INSURANCE | Attending: Medical | Primary: Medical

## 2019-09-18 DIAGNOSIS — E1159 Type 2 diabetes mellitus with other circulatory complications: Principal | ICD-10-CM

## 2019-09-18 DIAGNOSIS — I10 Essential (primary) hypertension: Principal | ICD-10-CM

## 2019-09-18 MED ORDER — PEN NEEDLE, DIABETIC 32 GAUGE X 1/4" (6 MM)
Freq: Every day | 2 refills | 0 days | Status: CP
Start: 2019-09-18 — End: ?

## 2019-09-18 MED ORDER — LISINOPRIL 10 MG TABLET
ORAL_TABLET | Freq: Every day | ORAL | 11 refills | 30.00000 days | Status: CP
Start: 2019-09-18 — End: 2020-09-17

## 2019-09-18 MED ORDER — OZEMPIC 0.25 MG OR 0.5 MG (2 MG/1.5 ML) SUBCUTANEOUS PEN INJECTOR
SUBCUTANEOUS | 0 refills | 127.00000 days | Status: CP
Start: 2019-09-18 — End: 2020-01-23

## 2019-10-09 ENCOUNTER — Ambulatory Visit: Admit: 2019-10-09

## 2019-11-14 DIAGNOSIS — E291 Testicular hypofunction: Principal | ICD-10-CM

## 2019-11-14 MED ORDER — TESTOSTERONE CYPIONATE 200 MG/ML INTRAMUSCULAR OIL
INTRAMUSCULAR | 1 refills | 84.00000 days | Status: CP
Start: 2019-11-14 — End: 2020-02-12

## 2019-12-13 DIAGNOSIS — E1159 Type 2 diabetes mellitus with other circulatory complications: Principal | ICD-10-CM

## 2019-12-13 MED ORDER — OZEMPIC 0.25 MG OR 0.5 MG (2 MG/1.5 ML) SUBCUTANEOUS PEN INJECTOR
SUBCUTANEOUS | 1 refills | 127.00000 days | Status: CP
Start: 2019-12-13 — End: 2020-04-18

## 2019-12-24 DIAGNOSIS — I10 Essential (primary) hypertension: Principal | ICD-10-CM

## 2019-12-24 DIAGNOSIS — E1159 Type 2 diabetes mellitus with other circulatory complications: Principal | ICD-10-CM

## 2019-12-25 MED ORDER — CARVEDILOL 3.125 MG TABLET
ORAL_TABLET | Freq: Two times a day (BID) | ORAL | 1 refills | 90 days | Status: CP
Start: 2019-12-25 — End: ?

## 2019-12-25 MED ORDER — METFORMIN ER 500 MG TABLET,EXTENDED RELEASE 24 HR
ORAL_TABLET | Freq: Two times a day (BID) | ORAL | 3 refills | 90.00000 days | Status: CP
Start: 2019-12-25 — End: 2020-03-24

## 2020-01-31 ENCOUNTER — Ambulatory Visit: Admit: 2020-01-31 | Discharge: 2020-02-01 | Payer: PRIVATE HEALTH INSURANCE

## 2020-01-31 DIAGNOSIS — M19012 Primary osteoarthritis, left shoulder: Principal | ICD-10-CM

## 2020-01-31 DIAGNOSIS — E119 Type 2 diabetes mellitus without complications: Principal | ICD-10-CM

## 2020-01-31 DIAGNOSIS — E785 Hyperlipidemia, unspecified: Principal | ICD-10-CM

## 2020-01-31 DIAGNOSIS — M19112 Post-traumatic osteoarthritis, left shoulder: Principal | ICD-10-CM

## 2020-01-31 DIAGNOSIS — Z955 Presence of coronary angioplasty implant and graft: Principal | ICD-10-CM

## 2020-01-31 DIAGNOSIS — I2510 Atherosclerotic heart disease of native coronary artery without angina pectoris: Principal | ICD-10-CM

## 2020-01-31 DIAGNOSIS — I10 Essential (primary) hypertension: Principal | ICD-10-CM

## 2020-01-31 DIAGNOSIS — Z951 Presence of aortocoronary bypass graft: Principal | ICD-10-CM

## 2020-02-15 ENCOUNTER — Ambulatory Visit: Admit: 2020-02-15 | Discharge: 2020-02-16 | Payer: PRIVATE HEALTH INSURANCE

## 2020-02-15 DIAGNOSIS — S46012D Strain of muscle(s) and tendon(s) of the rotator cuff of left shoulder, subsequent encounter: Principal | ICD-10-CM

## 2020-03-01 DIAGNOSIS — E1159 Type 2 diabetes mellitus with other circulatory complications: Principal | ICD-10-CM

## 2020-03-04 ENCOUNTER — Ambulatory Visit: Admit: 2020-03-04 | Discharge: 2020-03-05 | Payer: PRIVATE HEALTH INSURANCE

## 2020-03-04 DIAGNOSIS — S46012D Strain of muscle(s) and tendon(s) of the rotator cuff of left shoulder, subsequent encounter: Principal | ICD-10-CM

## 2020-03-04 MED ORDER — OZEMPIC 0.25 MG OR 0.5 MG (2 MG/1.5 ML) SUBCUTANEOUS PEN INJECTOR
SUBCUTANEOUS | 1 refills | 71 days | Status: CP
Start: 2020-03-04 — End: 2020-07-09

## 2020-03-07 ENCOUNTER — Ambulatory Visit: Admit: 2020-03-07 | Discharge: 2020-03-08 | Payer: PRIVATE HEALTH INSURANCE

## 2020-03-07 DIAGNOSIS — M75112 Incomplete rotator cuff tear or rupture of left shoulder, not specified as traumatic: Principal | ICD-10-CM

## 2020-03-13 DIAGNOSIS — Z01818 Encounter for other preprocedural examination: Principal | ICD-10-CM

## 2020-03-13 DIAGNOSIS — G8918 Other acute postprocedural pain: Principal | ICD-10-CM

## 2020-03-14 ENCOUNTER — Ambulatory Visit: Admit: 2020-03-14 | Discharge: 2020-03-14 | Payer: PRIVATE HEALTH INSURANCE

## 2020-03-14 DIAGNOSIS — Z01818 Encounter for other preprocedural examination: Principal | ICD-10-CM

## 2020-03-21 DIAGNOSIS — E785 Hyperlipidemia, unspecified: Principal | ICD-10-CM

## 2020-03-21 DIAGNOSIS — M7581 Other shoulder lesions, right shoulder: Principal | ICD-10-CM

## 2020-03-21 DIAGNOSIS — S46219A Strain of muscle, fascia and tendon of other parts of biceps, unspecified arm, initial encounter: Principal | ICD-10-CM

## 2020-03-21 DIAGNOSIS — M75111 Incomplete rotator cuff tear or rupture of right shoulder, not specified as traumatic: Principal | ICD-10-CM

## 2020-03-21 DIAGNOSIS — I2510 Atherosclerotic heart disease of native coronary artery without angina pectoris: Principal | ICD-10-CM

## 2020-03-21 DIAGNOSIS — M1990 Unspecified osteoarthritis, unspecified site: Principal | ICD-10-CM

## 2020-03-21 DIAGNOSIS — Z9889 Other specified postprocedural states: Principal | ICD-10-CM

## 2020-03-21 DIAGNOSIS — E119 Type 2 diabetes mellitus without complications: Principal | ICD-10-CM

## 2020-03-21 DIAGNOSIS — M75112 Incomplete rotator cuff tear or rupture of left shoulder, not specified as traumatic: Principal | ICD-10-CM

## 2020-03-21 DIAGNOSIS — I10 Essential (primary) hypertension: Principal | ICD-10-CM

## 2020-03-22 DIAGNOSIS — M75112 Incomplete rotator cuff tear or rupture of left shoulder, not specified as traumatic: Principal | ICD-10-CM

## 2020-03-27 ENCOUNTER — Telehealth: Admit: 2020-03-27 | Discharge: 2020-03-28 | Payer: PRIVATE HEALTH INSURANCE

## 2020-03-27 DIAGNOSIS — M75112 Incomplete rotator cuff tear or rupture of left shoulder, not specified as traumatic: Principal | ICD-10-CM

## 2020-03-27 DIAGNOSIS — I252 Old myocardial infarction: Principal | ICD-10-CM

## 2020-03-27 DIAGNOSIS — Z951 Presence of aortocoronary bypass graft: Principal | ICD-10-CM

## 2020-03-27 DIAGNOSIS — E1169 Type 2 diabetes mellitus with other specified complication: Principal | ICD-10-CM

## 2020-03-27 DIAGNOSIS — E669 Obesity, unspecified: Principal | ICD-10-CM

## 2020-03-27 DIAGNOSIS — I25119 Atherosclerotic heart disease of native coronary artery with unspecified angina pectoris: Principal | ICD-10-CM

## 2020-03-27 DIAGNOSIS — I639 Cerebral infarction, unspecified: Principal | ICD-10-CM

## 2020-03-27 DIAGNOSIS — I10 Essential (primary) hypertension: Principal | ICD-10-CM

## 2020-03-27 DIAGNOSIS — E785 Hyperlipidemia, unspecified: Principal | ICD-10-CM

## 2020-03-27 DIAGNOSIS — G4733 Obstructive sleep apnea (adult) (pediatric): Principal | ICD-10-CM

## 2020-03-28 DIAGNOSIS — M7581 Other shoulder lesions, right shoulder: Principal | ICD-10-CM

## 2020-03-28 DIAGNOSIS — E785 Hyperlipidemia, unspecified: Principal | ICD-10-CM

## 2020-03-28 DIAGNOSIS — M1990 Unspecified osteoarthritis, unspecified site: Principal | ICD-10-CM

## 2020-03-28 DIAGNOSIS — I2510 Atherosclerotic heart disease of native coronary artery without angina pectoris: Principal | ICD-10-CM

## 2020-03-28 DIAGNOSIS — E119 Type 2 diabetes mellitus without complications: Principal | ICD-10-CM

## 2020-03-28 DIAGNOSIS — S46219A Strain of muscle, fascia and tendon of other parts of biceps, unspecified arm, initial encounter: Principal | ICD-10-CM

## 2020-03-28 DIAGNOSIS — I10 Essential (primary) hypertension: Principal | ICD-10-CM

## 2020-03-28 DIAGNOSIS — M75111 Incomplete rotator cuff tear or rupture of right shoulder, not specified as traumatic: Principal | ICD-10-CM

## 2020-03-28 DIAGNOSIS — M75112 Incomplete rotator cuff tear or rupture of left shoulder, not specified as traumatic: Principal | ICD-10-CM

## 2020-04-01 ENCOUNTER — Ambulatory Visit: Admit: 2020-04-01 | Discharge: 2020-04-02 | Payer: PRIVATE HEALTH INSURANCE

## 2020-04-01 ENCOUNTER — Encounter
Admit: 2020-04-01 | Discharge: 2020-04-02 | Payer: PRIVATE HEALTH INSURANCE | Attending: Anesthesiology | Primary: Anesthesiology

## 2020-04-01 DIAGNOSIS — E119 Type 2 diabetes mellitus without complications: Principal | ICD-10-CM

## 2020-04-01 DIAGNOSIS — S46219A Strain of muscle, fascia and tendon of other parts of biceps, unspecified arm, initial encounter: Principal | ICD-10-CM

## 2020-04-01 DIAGNOSIS — I10 Essential (primary) hypertension: Principal | ICD-10-CM

## 2020-04-01 DIAGNOSIS — I2510 Atherosclerotic heart disease of native coronary artery without angina pectoris: Principal | ICD-10-CM

## 2020-04-01 DIAGNOSIS — M7581 Other shoulder lesions, right shoulder: Principal | ICD-10-CM

## 2020-04-01 DIAGNOSIS — M75112 Incomplete rotator cuff tear or rupture of left shoulder, not specified as traumatic: Principal | ICD-10-CM

## 2020-04-01 DIAGNOSIS — M1990 Unspecified osteoarthritis, unspecified site: Principal | ICD-10-CM

## 2020-04-01 DIAGNOSIS — M75111 Incomplete rotator cuff tear or rupture of right shoulder, not specified as traumatic: Principal | ICD-10-CM

## 2020-04-01 DIAGNOSIS — E785 Hyperlipidemia, unspecified: Principal | ICD-10-CM

## 2020-04-01 MED ORDER — ONDANSETRON HCL 4 MG TABLET
ORAL_TABLET | Freq: Three times a day (TID) | ORAL | 0 refills | 4.00000 days | Status: CP | PRN
Start: 2020-04-01 — End: 2020-04-18
  Filled 2020-04-01: qty 10, 4d supply, fill #0

## 2020-04-01 MED ORDER — OXYCODONE 5 MG TABLET
ORAL_TABLET | ORAL | 0 refills | 3.00000 days | Status: CP | PRN
Start: 2020-04-01 — End: 2020-04-06

## 2020-04-01 MED ORDER — ACETAMINOPHEN 500 MG TABLET
ORAL_TABLET | Freq: Three times a day (TID) | ORAL | 0 refills | 14 days | Status: CP
Start: 2020-04-01 — End: 2020-04-15
  Filled 2020-04-01: qty 84, 14d supply, fill #0

## 2020-04-01 MED ORDER — GABAPENTIN 100 MG CAPSULE
ORAL_CAPSULE | Freq: Three times a day (TID) | ORAL | 0 refills | 5 days | Status: CP
Start: 2020-04-01 — End: 2020-04-06
  Filled 2020-04-01: qty 15, 5d supply, fill #0

## 2020-04-01 MED ORDER — DOCUSATE SODIUM 100 MG CAPSULE
ORAL_CAPSULE | Freq: Two times a day (BID) | ORAL | 0 refills | 10 days | Status: CP
Start: 2020-04-01 — End: 2020-04-11
  Filled 2020-04-01: qty 20, 10d supply, fill #0

## 2020-04-02 DIAGNOSIS — E1169 Type 2 diabetes mellitus with other specified complication: Principal | ICD-10-CM

## 2020-04-02 MED ORDER — ONETOUCH ULTRA TEST STRIPS
ORAL_STRIP | 1 refills | 0 days | Status: CP
Start: 2020-04-02 — End: ?

## 2020-04-18 ENCOUNTER — Ambulatory Visit: Admit: 2020-04-18 | Discharge: 2020-04-19 | Payer: PRIVATE HEALTH INSURANCE

## 2020-04-18 DIAGNOSIS — S46012D Strain of muscle(s) and tendon(s) of the rotator cuff of left shoulder, subsequent encounter: Principal | ICD-10-CM

## 2020-04-18 DIAGNOSIS — E1169 Type 2 diabetes mellitus with other specified complication: Principal | ICD-10-CM

## 2020-04-18 DIAGNOSIS — M75112 Incomplete rotator cuff tear or rupture of left shoulder, not specified as traumatic: Principal | ICD-10-CM

## 2020-04-18 MED ORDER — LANCETS 33 GAUGE
1 refills | 0 days | Status: CP
Start: 2020-04-18 — End: ?

## 2020-04-19 ENCOUNTER — Ambulatory Visit
Admit: 2020-04-19 | Discharge: 2020-05-18 | Payer: PRIVATE HEALTH INSURANCE | Attending: Rehabilitative and Restorative Service Providers" | Primary: Rehabilitative and Restorative Service Providers"

## 2020-04-26 DIAGNOSIS — E1159 Type 2 diabetes mellitus with other circulatory complications: Principal | ICD-10-CM

## 2020-04-28 MED ORDER — OZEMPIC 0.25 MG OR 0.5 MG (2 MG/1.5 ML) SUBCUTANEOUS PEN INJECTOR
SUBCUTANEOUS | 0 refills | 127 days | Status: CP
Start: 2020-04-28 — End: 2020-09-02

## 2020-05-09 DIAGNOSIS — E291 Testicular hypofunction: Principal | ICD-10-CM

## 2020-05-09 MED ORDER — TESTOSTERONE CYPIONATE 200 MG/ML INTRAMUSCULAR OIL
INTRAMUSCULAR | 1 refills | 14 days | Status: CP
Start: 2020-05-09 — End: 2020-08-07

## 2020-06-04 ENCOUNTER — Ambulatory Visit: Admit: 2020-06-04 | Discharge: 2020-06-05 | Payer: PRIVATE HEALTH INSURANCE

## 2020-06-07 ENCOUNTER — Ambulatory Visit: Admit: 2020-06-07 | Discharge: 2020-06-08 | Payer: PRIVATE HEALTH INSURANCE | Attending: Medical | Primary: Medical

## 2020-06-07 DIAGNOSIS — I25119 Atherosclerotic heart disease of native coronary artery with unspecified angina pectoris: Principal | ICD-10-CM

## 2020-06-07 DIAGNOSIS — Z23 Encounter for immunization: Principal | ICD-10-CM

## 2020-06-07 DIAGNOSIS — E1159 Type 2 diabetes mellitus with other circulatory complications: Principal | ICD-10-CM

## 2020-06-07 DIAGNOSIS — E669 Obesity, unspecified: Principal | ICD-10-CM

## 2020-06-07 DIAGNOSIS — E1169 Type 2 diabetes mellitus with other specified complication: Principal | ICD-10-CM

## 2020-06-07 DIAGNOSIS — Z1211 Encounter for screening for malignant neoplasm of colon: Principal | ICD-10-CM

## 2020-06-07 DIAGNOSIS — I10 Essential (primary) hypertension: Principal | ICD-10-CM

## 2020-06-07 DIAGNOSIS — G2581 Restless legs syndrome: Principal | ICD-10-CM

## 2020-06-07 DIAGNOSIS — E291 Testicular hypofunction: Principal | ICD-10-CM

## 2020-06-07 DIAGNOSIS — I639 Cerebral infarction, unspecified: Principal | ICD-10-CM

## 2020-06-14 DIAGNOSIS — G2581 Restless legs syndrome: Secondary | ICD-10-CM | POA: Insufficient documentation

## 2020-06-14 MED ORDER — TESTOSTERONE CYPIONATE 200 MG/ML INTRAMUSCULAR OIL
INTRAMUSCULAR | 2 refills | 84 days | Status: CP
Start: 2020-06-14 — End: 2020-09-12

## 2020-06-14 MED ORDER — METFORMIN ER 500 MG TABLET,EXTENDED RELEASE 24 HR
ORAL_TABLET | Freq: Two times a day (BID) | ORAL | 3 refills | 90.00000 days | Status: CP
Start: 2020-06-14 — End: 2020-09-12

## 2020-06-14 MED ORDER — LISINOPRIL 10 MG TABLET
ORAL_TABLET | Freq: Every day | ORAL | 3 refills | 90.00000 days | Status: CP
Start: 2020-06-14 — End: 2021-06-14

## 2020-06-14 MED ORDER — CARVEDILOL 3.125 MG TABLET
ORAL_TABLET | Freq: Two times a day (BID) | ORAL | 3 refills | 90.00000 days | Status: CP
Start: 2020-06-14 — End: ?

## 2020-06-14 MED ORDER — OZEMPIC 0.25 MG OR 0.5 MG (2 MG/1.5 ML) SUBCUTANEOUS PEN INJECTOR
SUBCUTANEOUS | 3 refills | 84 days | Status: CP
Start: 2020-06-14 — End: 2020-09-12

## 2020-06-14 MED ORDER — LANCETS 33 GAUGE
1 refills | 0.00000 days | Status: CP
Start: 2020-06-14 — End: ?

## 2020-08-10 DIAGNOSIS — I25119 Atherosclerotic heart disease of native coronary artery with unspecified angina pectoris: Principal | ICD-10-CM

## 2020-08-20 ENCOUNTER — Ambulatory Visit: Admit: 2020-08-20 | Discharge: 2020-08-21 | Payer: PRIVATE HEALTH INSURANCE

## 2020-11-26 ENCOUNTER — Ambulatory Visit: Admit: 2020-11-26 | Discharge: 2020-11-27 | Payer: PRIVATE HEALTH INSURANCE

## 2020-11-26 ENCOUNTER — Ambulatory Visit: Admit: 2020-11-26 | Discharge: 2020-11-27 | Payer: PRIVATE HEALTH INSURANCE | Attending: Medical | Primary: Medical

## 2020-11-26 DIAGNOSIS — Z1211 Encounter for screening for malignant neoplasm of colon: Principal | ICD-10-CM

## 2020-11-26 DIAGNOSIS — M25471 Effusion, right ankle: Principal | ICD-10-CM

## 2020-11-26 DIAGNOSIS — M79671 Pain in right foot: Principal | ICD-10-CM

## 2020-11-26 DIAGNOSIS — E1169 Type 2 diabetes mellitus with other specified complication: Principal | ICD-10-CM

## 2020-11-26 DIAGNOSIS — M25571 Pain in right ankle and joints of right foot: Principal | ICD-10-CM

## 2020-11-26 DIAGNOSIS — I10 Essential (primary) hypertension: Principal | ICD-10-CM

## 2020-12-09 ENCOUNTER — Ambulatory Visit: Admit: 2020-12-09 | Discharge: 2020-12-10 | Payer: PRIVATE HEALTH INSURANCE | Attending: Medical | Primary: Medical

## 2020-12-09 DIAGNOSIS — Z0000 Encounter for general adult medical examination without abnormal findings: Principal | ICD-10-CM

## 2020-12-09 DIAGNOSIS — E1169 Type 2 diabetes mellitus with other specified complication: Principal | ICD-10-CM

## 2020-12-09 DIAGNOSIS — R413 Other amnesia: Principal | ICD-10-CM

## 2020-12-09 DIAGNOSIS — E1159 Type 2 diabetes mellitus with other circulatory complications: Principal | ICD-10-CM

## 2020-12-09 DIAGNOSIS — Z6835 Body mass index (BMI) 35.0-35.9, adult: Principal | ICD-10-CM

## 2020-12-09 DIAGNOSIS — E291 Testicular hypofunction: Principal | ICD-10-CM

## 2020-12-09 DIAGNOSIS — I10 Essential (primary) hypertension: Principal | ICD-10-CM

## 2020-12-09 DIAGNOSIS — I25119 Atherosclerotic heart disease of native coronary artery with unspecified angina pectoris: Principal | ICD-10-CM

## 2020-12-09 DIAGNOSIS — Z23 Encounter for immunization: Principal | ICD-10-CM

## 2020-12-09 DIAGNOSIS — G4733 Obstructive sleep apnea (adult) (pediatric): Principal | ICD-10-CM

## 2020-12-09 DIAGNOSIS — I639 Cerebral infarction, unspecified: Principal | ICD-10-CM

## 2020-12-09 DIAGNOSIS — E785 Hyperlipidemia, unspecified: Principal | ICD-10-CM

## 2020-12-09 MED ORDER — TESTOSTERONE CYPIONATE 100 MG/ML INTRAMUSCULAR OIL
INTRAMUSCULAR | 3 refills | 84.00000 days | Status: CP
Start: 2020-12-09 — End: 2021-12-09

## 2020-12-09 MED ORDER — OZEMPIC 2 MG/DOSE (8 MG/3 ML) SUBCUTANEOUS PEN INJECTOR
SUBCUTANEOUS | 4 refills | 168.00000 days | Status: CP
Start: 2020-12-09 — End: 2021-12-09

## 2020-12-09 MED ORDER — PEN NEEDLE, DIABETIC 32 GAUGE X 1/4" (6 MM)
Freq: Every day | 2 refills | 0 days | Status: CP
Start: 2020-12-09 — End: ?

## 2021-01-15 ENCOUNTER — Ambulatory Visit
Admit: 2021-01-15 | Discharge: 2021-01-16 | Payer: PRIVATE HEALTH INSURANCE | Attending: Internal Medicine | Primary: Internal Medicine

## 2021-01-15 DIAGNOSIS — E782 Mixed hyperlipidemia: Principal | ICD-10-CM

## 2021-01-15 DIAGNOSIS — I10 Essential (primary) hypertension: Principal | ICD-10-CM

## 2021-01-15 DIAGNOSIS — Z951 Presence of aortocoronary bypass graft: Principal | ICD-10-CM

## 2021-01-15 DIAGNOSIS — R0609 Other forms of dyspnea: Principal | ICD-10-CM

## 2021-01-15 DIAGNOSIS — I2510 Atherosclerotic heart disease of native coronary artery without angina pectoris: Principal | ICD-10-CM

## 2021-01-15 DIAGNOSIS — I252 Old myocardial infarction: Principal | ICD-10-CM

## 2021-01-15 DIAGNOSIS — G4733 Obstructive sleep apnea (adult) (pediatric): Principal | ICD-10-CM

## 2021-01-16 ENCOUNTER — Telehealth: Payer: Self-pay

## 2021-01-16 ENCOUNTER — Other Ambulatory Visit: Payer: Self-pay

## 2021-01-16 DIAGNOSIS — Z1211 Encounter for screening for malignant neoplasm of colon: Secondary | ICD-10-CM

## 2021-01-16 NOTE — Progress Notes (Signed)
Blood thinner clearance °

## 2021-01-16 NOTE — Progress Notes (Signed)
Gastroenterology Pre-Procedure Review  Request Date: 02/06/21 Requesting Physician: Dr. Servando Snare  PATIENT REVIEW QUESTIONS: The patient responded to the following health history questions as indicated:    1. Are you having any GI issues? no 2. Do you have a personal history of Polyps?  EGD in 2012 3. Do you have a family history of Colon Cancer or Polyps? no 4. Diabetes Mellitus? yes (Type II) 5. Joint replacements in the past 12 months?no 6. Major health problems in the past 3 months?no 7. Any artificial heart valves, MVP, or defibrillator?no    MEDICATIONS & ALLERGIES:    Patient reports the following regarding taking any anticoagulation/antiplatelet therapy:   Plavix, Coumadin, Eliquis, Xarelto, Lovenox, Pradaxa, Brilinta, or Effient? yes (Plavix 75 mg) Aspirin? yes (81 mg)  Patient confirms/reports the following medications:  Current Outpatient Medications  Medication Sig Dispense Refill   acetaminophen (TYLENOL) 325 MG tablet Take 650 mg by mouth every 6 (six) hours as needed. For pain     aspirin 325 MG EC tablet Take 325 mg by mouth daily.       clopidogrel (PLAVIX) 75 MG tablet Take 75 mg by mouth daily.     isosorbide mononitrate (IMDUR) 60 MG 24 hr tablet Take 60 mg by mouth daily.     metoprolol tartrate (LOPRESSOR) 25 MG tablet Take 25 mg by mouth 2 (two) times daily.       nebivolol (BYSTOLIC) 10 MG tablet Take 10 mg by mouth daily.     Pitavastatin Calcium (LIVALO) 4 MG TABS Take 4 mg by mouth at bedtime.     Probiotic Product (PHILLIPS COLON HEALTH PO) Take 1 capsule by mouth daily.      ranolazine (RANEXA) 500 MG 12 hr tablet Take 500 mg by mouth 2 (two) times daily.     No current facility-administered medications for this visit.    Patient confirms/reports the following allergies:  Allergies  Allergen Reactions   Flagyl [Metronidazole Hcl] Hives   Statins     Fatigue and myalgias as well as memory loss    No orders of the defined types were placed in this  encounter.   AUTHORIZATION INFORMATION Primary Insurance: 1D#: Group #:  Secondary Insurance: 1D#: Group #:  SCHEDULE INFORMATION: Date: 02/06/21 Time: Location: [facility]

## 2021-01-16 NOTE — Telephone Encounter (Signed)
Procedure scheduled for 02/06/21. 

## 2021-01-16 NOTE — Telephone Encounter (Signed)
Pt called and is ready to schedule his colonoscopy

## 2021-01-21 ENCOUNTER — Encounter: Payer: Self-pay | Admitting: Gastroenterology

## 2021-01-23 ENCOUNTER — Encounter: Payer: Self-pay | Admitting: Anesthesiology

## 2021-01-27 ENCOUNTER — Ambulatory Visit: Admit: 2021-01-27 | Discharge: 2021-01-28 | Payer: PRIVATE HEALTH INSURANCE

## 2021-01-30 ENCOUNTER — Telehealth: Payer: Self-pay

## 2021-01-30 NOTE — Telephone Encounter (Signed)
Patient has been informed of blood thinner clearance. Per Dr. Maryagnes Amos: pt can hold Plavix (5) days prior to procedure. Restart (1) day after. Must continue Aspirin 81 mg. Pt verbalized understanding. Clearance will be scanned into chart.

## 2021-01-31 ENCOUNTER — Other Ambulatory Visit: Payer: Self-pay

## 2021-02-04 ENCOUNTER — Telehealth: Payer: Self-pay | Admitting: Gastroenterology

## 2021-02-04 ENCOUNTER — Ambulatory Visit: Admit: 2021-02-04 | Discharge: 2021-02-04 | Payer: PRIVATE HEALTH INSURANCE

## 2021-02-04 DIAGNOSIS — I25119 Atherosclerotic heart disease of native coronary artery with unspecified angina pectoris: Principal | ICD-10-CM

## 2021-02-04 DIAGNOSIS — E782 Mixed hyperlipidemia: Principal | ICD-10-CM

## 2021-02-04 DIAGNOSIS — E1169 Type 2 diabetes mellitus with other specified complication: Principal | ICD-10-CM

## 2021-02-04 MED ORDER — FUROSEMIDE 40 MG TABLET
ORAL_TABLET | Freq: Every day | ORAL | 6 refills | 30 days | Status: CP
Start: 2021-02-04 — End: 2021-03-06

## 2021-02-04 MED ORDER — BLOOD-GLUCOSE METER KIT WRAPPER
0 refills | 0 days | Status: CP
Start: 2021-02-04 — End: 2022-02-04

## 2021-02-04 MED ORDER — ALIROCUMAB 75 MG/ML SUBCUTANEOUS PEN INJECTOR
SUBCUTANEOUS | 3 refills | 0 days | Status: CP
Start: 2021-02-04 — End: ?
  Filled 2021-02-13: qty 2, 28d supply, fill #0

## 2021-02-04 MED ORDER — FUROSEMIDE 20 MG TABLET
ORAL_TABLET | Freq: Every day | ORAL | 6 refills | 30 days | Status: CP
Start: 2021-02-04 — End: 2021-02-04

## 2021-02-04 NOTE — Telephone Encounter (Signed)
Patient will call back once he has been cleared from surgery. Advised patient to speak with his cardiologist regarding when he should reschedule for colonoscopy.

## 2021-02-04 NOTE — Telephone Encounter (Signed)
Inbound call from pt requesting a call back to r/s his procedure. Pt is having a stint procedure. Thank you.

## 2021-02-05 ENCOUNTER — Telehealth: Payer: Self-pay | Admitting: Gastroenterology

## 2021-02-05 DIAGNOSIS — I25119 Atherosclerotic heart disease of native coronary artery with unspecified angina pectoris: Principal | ICD-10-CM

## 2021-02-05 DIAGNOSIS — E782 Mixed hyperlipidemia: Principal | ICD-10-CM

## 2021-02-05 DIAGNOSIS — E1169 Type 2 diabetes mellitus with other specified complication: Principal | ICD-10-CM

## 2021-02-05 NOTE — Telephone Encounter (Signed)
Patient wants to reschedule procedure. Clinical staff will follow up with patient. 

## 2021-02-06 ENCOUNTER — Ambulatory Visit: Admit: 2021-02-06 | Payer: Managed Care, Other (non HMO) | Admitting: Gastroenterology

## 2021-02-06 ENCOUNTER — Other Ambulatory Visit: Payer: Self-pay

## 2021-02-06 HISTORY — DX: Type 2 diabetes mellitus without complications: E11.9

## 2021-02-06 SURGERY — COLONOSCOPY WITH PROPOFOL
Anesthesia: Choice

## 2021-02-06 NOTE — Telephone Encounter (Signed)
Returned patients call. Unable to leave message. Voicemail was full. 

## 2021-02-10 NOTE — Unmapped (Signed)
[facility] Specialty Medication Onboarding    Specialty Medication: PRALUENT PEN 75 mg/mL Pnij (alirocumab)  Prior Authorization: Approved   Financial Assistance: No - copay  <$25  Final Copay/Day Supply: $0 / 28    Insurance Restrictions: Yes - max 1 month supply     Notes to Pharmacist: n/a    The triage team has completed the benefits investigation and has determined that the patient is able to fill this medication at [facility]. Please contact the patient to complete the onboarding or follow up with the prescribing physician as needed.

## 2021-02-11 MED ORDER — EMPTY CONTAINER
2 refills | 0 days
Start: 2021-02-11 — End: ?

## 2021-02-11 NOTE — Unmapped (Signed)
[facility]   Patient Onboarding/Medication Counseling    Chad Hines is a 65 y.o. male with hyperlipidemia who I am counseling today on initiation of therapy.  I am speaking to the patient.    Was a Nurse, learning disability used for this call? No    Verified patient's date of birth / HIPAA.    Specialty medication(s) to be sent: General Specialty: Praluent      Non-specialty medications/supplies to be sent: Higher education careers adviser      Medications not needed at this time: n/a         Praluent (alirocumab)    Medication & Administration     Dosage: Inject the contents of 1 pen (75mg ) under the skin every 2 weeks.    Administration: Inject under the skin of the thigh, abdomen or upper arm. Rotate sites with each injection.  ??? Injection instructions - Pen:  o Take 1 (or 2) pens out of the refrigerator and allow to stand at room temperature for 30 to 40 minutes  o Check the pen(s) for the following:  - Expiration date  - Absence of damage or cracks  - Medication is clear, colorless or pale yellow and free from particles  o Choose your injection site and clean with alcohol wipe. Allow to air dry completely.  o Pull the blue needle cap straight off and discard  o Hold the pen in your palm with your thumb over the green button, but do not touch the green button yet.  o Press the pen unto your skin at a 90 degree angle until the yellow safety cover disappears  o Push the green button and immediately release. You will hear a ???click.??? This signifies that the injection has started.  o Continue to hold the pen in place, maintaining pressure, until the entire window has turned yellow.  - This may take up to 20 seconds.  - You may hear a second ???click??? when the injection is complete.  o Lift the pen straight off your skin and dispose of it in a sharps container  o If there is blood at the injection site gently press a cotton ball or guaze to the site. Do not rub the injection site.    Adherence/Missed dose instructions: Administer a missed dose within 7 days and resume your normal schedule. If it has been more than 7 days and you inject every 2 weeks, skip the missed dose and resume your normal schedule..     Goals of Therapy     Lower cholesterol  Lower the risk of heart attack, stroke and unstable angina in people with heart disease    Side Effects & Monitoring Parameters   ??? Injection site irritation  ??? Flu-like symptoms  ??? Nose or throat irritation    The following side effects should be reported to the provider:  ??? Signs of an allergic reaction    Contraindications, Warnings, & Precautions     ??? Hypersensitivity    Drug/Food Interactions     ??? Medication list reviewed in Epic. The patient was instructed to inform the care team before taking any new medications or supplements. No drug interactions identified.     Storage, Handling Precautions, & Disposal     ??? Praluent should be stored in the refrigerator.   o If necessary Praluent may be stored at room temperature, in the original carton, for no more than 30 days.  ??? Place used devices into a sharps container for disposal  Current Medications (including OTC/herbals), Comorbidities and Allergies     Current Outpatient Medications   Medication Sig Dispense Refill   ??? alirocumab 75 mg/mL PnIj Inject the contents of one pen (75 mg) under the skin every fourteen (14) days. 6 mL 3   ??? aspirin 325 MG tablet Take 325 mg by mouth daily.     ??? blood sugar diagnostic (ONETOUCH ULTRA TEST) Strp CHECK BLOOD SUGAR AS DIRECTED ONCE A DAY AND FOR SYMPTOMS OF HIGH OR LOW BLOOD SUGAR. 100 strip 1   ??? blood-glucose meter kit Use as instructed 1 each 0   ??? carvediloL (COREG) 3.125 MG tablet Take 1 tablet (3.125 mg total) by mouth Two (2) times a day. 180 tablet 3   ??? clopidogrel (PLAVIX) 75 mg tablet Take 75 mg by mouth daily.     ??? EPINEPHrine (EPIPEN) 0.3 mg/0.3 mL injection Inject 0.3 mg into the muscle.      ??? furosemide (LASIX) 40 MG tablet Take 1 tablet (40 mg total) by mouth daily. 30 tablet 6   ??? ibuprofen (ADVIL,MOTRIN) 200 MG tablet Take 400 mg by mouth every eight (8) hours as needed for fever.      ??? lancets (ONETOUCH DELICA LANCETS) 33 gauge Misc CHECK BLOOD SUGAR AS DIRECTED ONCE A DAY AND FOR SYMPTOMS OF HIGH OR LOW BLOOD SUGAR. 100 each 1   ??? lisinopriL (PRINIVIL,ZESTRIL) 10 MG tablet Take 1 tablet (10 mg total) by mouth daily. 90 tablet 3   ??? metFORMIN (GLUCOPHAGE-XR) 500 MG 24 hr tablet Take 2 tablets (1,000 mg total) by mouth 2 (two) times a day with meals. 360 tablet 3   ??? multivitamin (TAB-A-VITE/THERAGRAN) per tablet Take 1 tablet by mouth daily.     ??? nitroglycerin (NITROSTAT) 0.4 MG SL tablet Place 0.4 mg under the tongue every five (5) minutes as needed for chest pain.     ??? OZEMPIC 0.25 mg or 0.5 mg(2 mg/1.5 mL) PnIj injection INJECT 0.5 MG UNDER THE SKIN EVERY SEVEN (7) DAYS.     ??? pen needle, diabetic (NOVOFINE 32) 32 gauge x 1/4 (6 mm) Ndle 1 Units by Miscellaneous route daily. 100 each 2   ??? prednisoLONE acetate (PRED FORTE) 1 % ophthalmic suspension PLACE 1 DROP INTO THE RIGHT EYE 3 TIMES DAILY FOR 7 DAYS.     ??? semaglutide (OZEMPIC) 2 mg/dose (8 mg/3 mL) PnIj Inject 1 mg under the skin every seven (7) days. 9 mL 4   ??? testosterone cypionate (DEPOTESTOTERONE CYPIONATE) 100 mg/mL injection Inject 0.75 mL (75 mg total) into the muscle every fourteen (14) days. 4.5 mL 3     No current facility-administered medications for this visit.       Allergies   Allergen Reactions   ??? Metronidazole Hives   ??? Atorvastatin Other (See Comments)     Memory  loss   ??? Ezetimibe Other (See Comments)     Memory loss   ??? Pitavastatin Other (See Comments)     Memory loss   ??? Statins-Hmg-Coa Reductase Inhibitors Other (See Comments)     Fatigue and myalgias as well as memory loss       Patient Active Problem List   Diagnosis   ??? History of MI (myocardial infarction)   ??? 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 Essential hypertension   ??? Hyperlipidemia   ??? Myositis   ??? Old myocardial infarction   ??? S/P coronary artery bypass  graft x 4   ??? Severe obstructive sleep apnea   ??? Annual physical exam   ??? Diabetes mellitus (CMS-HCC)   ??? Fatigue   ??? Hypogonadism male   ??? Ocular ischemic syndrome   ??? Body mass index (BMI)40.0-44.9, adult   ??? Screening for colon cancer   ??? Stress   ??? Class 2 obesity in adult   ??? CVA (cerebral vascular accident) (CMS-HCC)   ??? RLS (restless legs syndrome)   ??? Right foot pain   ??? Pain and swelling of right ankle   ??? Health care maintenance   ??? Memory difficulties   ??? DOE (dyspnea on exertion)       Reviewed and up to date in Epic.    Appropriateness of Therapy     Acute infections noted within Epic:  No active infections  Patient reported infection: None    Is medication and dose appropriate based on diagnosis and infection status? Yes    Prescription has been clinically reviewed: Yes      Baseline Quality of Life Assessment      How many days over the past month did your hyperlipidemia  keep you from your normal activities? For example, brushing your teeth or getting up in the morning. Patient declined to answer    Financial Information     Medication Assistance provided: Prior Authorization    Anticipated copay of $0 (28 days) reviewed with patient. Verified delivery address.    Delivery Information     Scheduled delivery date: 02/13/21    Expected start date: 02/13/21    Medication will be delivered via Same Day Courier to the prescription address in [place].  This shipment will not require a signature.      Explained the services we provide at [facility] and that each month we would call to set up refills.  Stressed importance of returning phone calls so that we could ensure they receive their medications in time each month.  Informed patient that we should be setting up refills 7-10 days prior to when they will run out of medication.  A pharmacist will reach out to perform a clinical assessment periodically.  Informed patient that a welcome packet, containing information about our pharmacy and other support services, a Notice of Privacy Practices, and a drug information handout will be sent.      The patient or caregiver noted above participated in the development of this care plan and knows that they can request review of or adjustments to the care plan at any time.      Patient or caregiver verbalized understanding of the above information as well as how to contact the pharmacy at [phone] option 4 with any questions/concerns.  The pharmacy is open Monday through Friday 8:30am-4:30pm.  A pharmacist is available 24/7 via pager to answer any clinical questions they may have.    Patient Specific Needs     - Does the patient have any physical, cognitive, or cultural barriers? No    - Does the patient have adequate living arrangements? (i.e. the ability to store and take their medication appropriately) Yes    - Did you identify any home environmental safety or security hazards? No    - Patient prefers to have medications discussed with  Patient     - Is the patient or caregiver able to read and understand education materials at a high school level or above? Yes    - Patient's primary language is  English     - Is the patient high risk? No    SOCIAL DETERMINANTS OF HEALTH     At the [facility], we have learned that life circumstances - like trouble affording food, housing, utilities, or transportation can affect the health of many of our patients.   That is why we wanted to ask: are you currently experiencing any life circumstances that are negatively impacting your health and/or quality of life? Patient declined to answer    Social Determinants of Health     Food Insecurity: Not on file   Tobacco Use: Medium Risk   ??? Smoking Tobacco Use: Former   ??? Smokeless Tobacco Use: Never   ??? Passive Exposure: Not on file   Transportation Needs: Not on file   Alcohol Use: Not on file Housing/Utilities: Not on file   Substance Use: Not on file   Financial Resource Strain: Not on file   Physical Activity: Not on file   Health Literacy: Low Risk    ??? : Never   Stress: Not on file   Intimate Partner Violence: Not on file   Depression: Not at risk   ??? PHQ-2 Score: 0   Social Connections: Not on file       Would you be willing to receive help with any of the needs that you have identified today? Not applicable       [name]  [facility] Specialty Pharmacist

## 2021-02-13 MED FILL — EMPTY CONTAINER: 120 days supply | Qty: 1 | Fill #0

## 2021-02-17 NOTE — Unmapped (Signed)
Error

## 2021-03-06 ENCOUNTER — Ambulatory Visit
Admit: 2021-03-06 | Discharge: 2021-03-07 | Payer: PRIVATE HEALTH INSURANCE | Attending: Internal Medicine | Primary: Internal Medicine

## 2021-03-06 DIAGNOSIS — Z951 Presence of aortocoronary bypass graft: Principal | ICD-10-CM

## 2021-03-06 DIAGNOSIS — G4733 Obstructive sleep apnea (adult) (pediatric): Principal | ICD-10-CM

## 2021-03-06 DIAGNOSIS — I10 Essential (primary) hypertension: Principal | ICD-10-CM

## 2021-03-06 DIAGNOSIS — E782 Mixed hyperlipidemia: Principal | ICD-10-CM

## 2021-03-06 DIAGNOSIS — I2510 Atherosclerotic heart disease of native coronary artery without angina pectoris: Principal | ICD-10-CM

## 2021-03-06 DIAGNOSIS — E1169 Type 2 diabetes mellitus with other specified complication: Principal | ICD-10-CM

## 2021-03-07 NOTE — Unmapped (Signed)
Cardiology Outpatient Follow Up Visit    PCP: Filomena Jungling, [facility]    Cardiology Assessment & Plan:    1.  CAD with prior CABG and PCI to LCX   -LHC last month showed patent LIMA-LAD and LCX stents with elevated LVEDP   -Reassure, CPM; work on exercise and weight loss   -has severe OSA just intolerant to CPAP/BIPAP   -Target LDL <70 ideally   -DM control improved   -Applauded on weight loss over past year and encouraged to continue   -Follow up in    History of Present Illness:     Chad Hines is a 66 y.o. male with a past medical history significant for CAD, S/P PCI, S/P CABG, HTN, DM, Dyslipidemia, OSA who presents for  cardiac follow up.    The patient states he is doing pretty good.  Post Cath I doubled his lasix dose to 40mg  and breathing has imrpoved.  See below his CAD and anatomy.  He has severe OSA and still an issue with intolerance to CPAP/BIPAP.  He is compliant with his medications.     LHC 01/2021: Conclusions:  ??? Severe Native 2V CAD with CTO LAD and nondominant RCA; patent LCX / OM stents  ??? Patent LIMA-LAD; chronic occlusions of SVG x 2  ??? NL LVEF 50-55% with inferobasal/posterior HK  ??? Elevated LVEDP      Allergies   Allergen Reactions   ??? Metronidazole Hives   ??? Atorvastatin Other (See Comments)     Memory  loss   ??? Ezetimibe Other (See Comments)     Memory loss   ??? Pitavastatin Other (See Comments)     Memory loss   ??? Statins-Hmg-Coa Reductase Inhibitors Other (See Comments)     Fatigue and myalgias as well as memory loss       Medications:     Current Outpatient Medications:   ???  alirocumab 75 mg/mL PnIj, Inject the contents of one pen (75 mg) under the skin every fourteen (14) days., Disp: 6 mL, Rfl: 3  ???  aspirin 325 MG tablet, Take 325 mg by mouth daily., Disp: , Rfl:   ???  blood sugar diagnostic (ONETOUCH ULTRA TEST) Strp, CHECK BLOOD SUGAR AS DIRECTED ONCE A DAY AND FOR SYMPTOMS OF HIGH OR LOW BLOOD SUGAR., Disp: 100 strip, Rfl: 1  ???  blood-glucose meter kit, Use as instructed, Disp: 1 each, Rfl: 0  ???  carvediloL (COREG) 3.125 MG tablet, Take 1 tablet (3.125 mg total) by mouth Two (2) times a day., Disp: 180 tablet, Rfl: 3  ???  clopidogrel (PLAVIX) 75 mg tablet, Take 75 mg by mouth daily., Disp: , Rfl:   ???  empty container (SHARPS-A-GATOR DISPOSAL SYSTEM) Misc, Use as directed for sharps disposal, Disp: 1 each, Rfl: 2  ???  EPINEPHrine (EPIPEN) 0.3 mg/0.3 mL injection, Inject 0.3 mg into the muscle. , Disp: , Rfl:   ???  furosemide (LASIX) 40 MG tablet, Take 1 tablet (40 mg total) by mouth daily., Disp: 30 tablet, Rfl: 6  ???  ibuprofen (ADVIL,MOTRIN) 200 MG tablet, Take 400 mg by mouth every eight (8) hours as needed for fever. , Disp: , Rfl:   ???  lancets (ONETOUCH DELICA LANCETS) 33 gauge Misc, CHECK BLOOD SUGAR AS DIRECTED ONCE A DAY AND FOR SYMPTOMS OF HIGH OR LOW BLOOD SUGAR., Disp: 100 each, Rfl: 1  ???  lisinopriL (PRINIVIL,ZESTRIL) 10 MG tablet, Take 1 tablet (10 mg total) by mouth daily., Disp:  90 tablet, Rfl: 3  ???  multivitamin (TAB-A-VITE/THERAGRAN) per tablet, Take 1 tablet by mouth daily., Disp: , Rfl:   ???  nitroglycerin (NITROSTAT) 0.4 MG SL tablet, Place 0.4 mg under the tongue every five (5) minutes as needed for chest pain., Disp: , Rfl:   ???  OZEMPIC 0.25 mg or 0.5 mg(2 mg/1.5 mL) PnIj injection, INJECT 0.5 MG UNDER THE SKIN EVERY SEVEN (7) DAYS., Disp: , Rfl:   ???  pen needle, diabetic (NOVOFINE 32) 32 gauge x 1/4 (6 mm) Ndle, 1 Units by Miscellaneous route daily., Disp: 100 each, Rfl: 2  ???  prednisoLONE acetate (PRED FORTE) 1 % ophthalmic suspension, PLACE 1 DROP INTO THE RIGHT EYE 3 TIMES DAILY FOR 7 DAYS., Disp: , Rfl:   ???  semaglutide (OZEMPIC) 2 mg/dose (8 mg/3 mL) PnIj, Inject 1 mg under the skin every seven (7) days., Disp: 9 mL, Rfl: 4  ???  testosterone cypionate (DEPOTESTOTERONE CYPIONATE) 100 mg/mL injection, Inject 0.75 mL (75 mg total) into the muscle every fourteen (14) days., Disp: 4.5 mL, Rfl: 3  ???  metFORMIN (GLUCOPHAGE-XR) 500 MG 24 hr tablet, Take 2 tablets (1,000 mg total) by mouth 2 (two) times a day with meals., Disp: 360 tablet, Rfl: 3     Past Medical History:   Diagnosis Date   ??? Arthritis    ??? Body mass index (BMI)40.0-44.9, adult 02/22/2019   ??? CAD (coronary artery disease)    ??? Cataract    ??? CVA (cerebral vascular accident) (CMS-HCC) 03/27/2020   ??? Diabetes mellitus (CMS-HCC) 11/25/2018   ??? HTN (hypertension)    ??? Hyperlipemia    ??? Myocardial infarction (CMS-HCC)    ??? Myositis    ??? OSA (obstructive sleep apnea)        Past Surgical History:   Procedure Laterality Date   ??? CARDIAC CATHETERIZATION  12/2017    Native 3V CAD, occluded SVG's x 3; patent LIMA-LAD, patent stent native LAD post LIMA, patent LCX stents;  no targets for PCI   ??? COLON SURGERY     ??? CORONARY ARTERY BYPASS GRAFT  2011    4V LIMA-LAD, SVG-RCA, SVG-OM, SVG-unknown vessel in [place]   ??? CORONARY STENT PLACEMENT  2012    Stent to native LAD post LIMA insertion   ??? EYE SURGERY      Cataracts removed   ??? HERNIA REPAIR     ??? PR CATH PLACE/CORON ANGIO, IMG SUPER/INTERP,W LEFT HEART VENTRICULOGRAPHY N/A 02/04/2021    Procedure: LEFT HEART CATHETERIZATION WITH POSSIBLE REVASCULARIZATION;  Surgeon: Jacqualyn Posey, MD;  Location: [facility] CATH;  Service: Cardiology   ??? PR KNEE SCOPE,ABRASN ARTHROPLASTY Left 01/08/2014    Procedure: ARTHROSCOPY, KNEE, SURG; ABRASION ARTHROPLASTY (W/CHONDROPLASTY AS NEEDED) OR MULTIPLE DRILLING/MICROFRACT;  Surgeon: Orrin Brigham, MD;  Location: [facility] OR [facility];  Service: Orthopedics   ??? PR SHLDR ARTHROSCOP,SURG,W/ROTAT CUFF REPR Left 04/01/2020    Procedure: ARTHROSCOPY, SHOULDER, SURGICAL; WITH ROTATOR CUFF REPAIR;  Surgeon: Tomasa Rand, MD;  Location[facility] OR [facility];  Service: Orthopedics   ??? REFRACTIVE SURGERY     ??? SPINE SURGERY     ??? VASECTOMY         Social History:  Social History     Tobacco Use   Smoking Status Former   ??? Packs/day: 1.00   ??? Years: 2.00   ??? Pack years: 2.00   ??? Types: Cigarettes   ??? Quit date: 05/16/1985   ??? Years since quitting: 35.8  Smokeless Tobacco Never     Social History     Substance and Sexual Activity   Alcohol Use Yes   ??? Alcohol/week: 5.0 standard drinks   ??? Types: 2 Glasses of wine, 1 Cans of beer, 2 Standard drinks or equivalent per week     Social History     Substance and Sexual Activity   Drug Use No       Family History   Problem Relation Age of Onset   ??? Arthritis Mother    ??? Miscarriages / [place] Mother    ??? Cancer Maternal Grandfather    ??? Diabetes Maternal Uncle    ??? Liver disease Maternal Uncle    ??? Heart disease Father    ??? Heart disease Brother    ??? Heart disease Maternal Grandmother    ??? Anesthesia problems Neg Hx    ??? Broken bones Neg Hx    ??? Cancer Neg Hx    ??? Clotting disorder Neg Hx    ??? Collagen disease Neg Hx    ??? Diabetes Neg Hx    ??? Dislocations Neg Hx    ??? Fibromyalgia Neg Hx    ??? Gout Neg Hx    ??? Hemophilia Neg Hx    ??? Osteoporosis Neg Hx    ??? Rheumatologic disease Neg Hx    ??? Scoliosis Neg Hx    ??? Severe sprains Neg Hx    ??? Sickle cell anemia Neg Hx    ??? Spinal Compression Fracture Neg Hx        Review of Systems: All positive and pertinent negatives are noted in the HPI; otherwise all other systems are negative.    Objective     Physical Exam:  BP 117/84 (BP Site: L Arm, BP Position: Sitting, BP Cuff Size: Medium)  - Pulse 73  - Ht 175.3 cm (5' 9)  - Wt (!) 109.4 kg (241 lb 3.2 oz)  - BMI 35.62 kg/m??   GEN: Chad Hines is a pleasant male in no acute distress.   HEENT: Atraumatic, normocephalic   LUNGS: Clear to auscultation bilaterally.  No increased work of breathing or signs of respiratory distress.  CV: regular rate and rhythm, S1, S2 normal, no murmur, click, rub or gallop.  No lower extremity edema bilaterally.  GI: Abdomen is soft with normal bowel sounds. Protuberant.  VASCULAR:  2+ DP/PT lower extremity pulses bilaterally, 2+ carotid pulses, no cervical bruits auscultated.  Radial pulses 2+ and symmetrical.  MUSCULOSKELETAL: Gait and station normal.   SKIN: Warm,  Good turgor.  PSYCHIATRIC: Mood and affect normal.  NEURO: Alert and oriented x 3. CN II-XII are grossly intact.     Thank you for allowing me to participate in the care of this patient.      Geni Bers, MD, [facility]  03/06/2021, 4:11 PM

## 2021-03-10 DIAGNOSIS — E782 Mixed hyperlipidemia: Principal | ICD-10-CM

## 2021-03-10 DIAGNOSIS — I25119 Atherosclerotic heart disease of native coronary artery with unspecified angina pectoris: Principal | ICD-10-CM

## 2021-03-10 DIAGNOSIS — E1169 Type 2 diabetes mellitus with other specified complication: Principal | ICD-10-CM

## 2021-03-19 NOTE — Unmapped (Signed)
[facility] Clinical Assessment & Refill Coordination Note    Chad Hines, Chad Hines: 05/25/1955  Phone: [phone] (home)     All above HIPAA information was verified with patient.     Was a Nurse, learning disability used for this call? No    Specialty Medication(s):   General Specialty: Praluent     Current Outpatient Medications   Medication Sig Dispense Refill   ??? alirocumab 75 mg/mL PnIj Inject the contents of one pen (75 mg) under the skin every fourteen (14) days. 6 mL 3   ??? aspirin 325 MG tablet Take 325 mg by mouth daily.     ??? blood sugar diagnostic (ONETOUCH ULTRA TEST) Strp CHECK BLOOD SUGAR AS DIRECTED ONCE A DAY AND FOR SYMPTOMS OF HIGH OR LOW BLOOD SUGAR. 100 strip 1   ??? blood-glucose meter kit Use as instructed 1 each 0   ??? carvediloL (COREG) 3.125 MG tablet Take 1 tablet (3.125 mg total) by mouth Two (2) times a day. 180 tablet 3   ??? clopidogrel (PLAVIX) 75 mg tablet Take 75 mg by mouth daily.     ??? empty container (SHARPS-A-GATOR DISPOSAL SYSTEM) Misc Use as directed for sharps disposal 1 each 2   ??? EPINEPHrine (EPIPEN) 0.3 mg/0.3 mL injection Inject 0.3 mg into the muscle.      ??? furosemide (LASIX) 40 MG tablet Take 1 tablet (40 mg total) by mouth daily. 30 tablet 6   ??? ibuprofen (ADVIL,MOTRIN) 200 MG tablet Take 400 mg by mouth every eight (8) hours as needed for fever.      ??? lancets (ONETOUCH DELICA LANCETS) 33 gauge Misc CHECK BLOOD SUGAR AS DIRECTED ONCE A DAY AND FOR SYMPTOMS OF HIGH OR LOW BLOOD SUGAR. 100 each 1   ??? lisinopriL (PRINIVIL,ZESTRIL) 10 MG tablet Take 1 tablet (10 mg total) by mouth daily. 90 tablet 3   ??? metFORMIN (GLUCOPHAGE-XR) 500 MG 24 hr tablet Take 2 tablets (1,000 mg total) by mouth 2 (two) times a day with meals. 360 tablet 3   ??? multivitamin (TAB-A-VITE/THERAGRAN) per tablet Take 1 tablet by mouth daily.     ??? nitroglycerin (NITROSTAT) 0.4 MG SL tablet Place 0.4 mg under the tongue every five (5) minutes as needed for chest pain.     ??? OZEMPIC 0.25 mg or 0.5 mg(2 mg/1.5 mL) PnIj injection INJECT 0.5 MG UNDER THE SKIN EVERY SEVEN (7) DAYS.     ??? pen needle, diabetic (NOVOFINE 32) 32 gauge x 1/4 (6 mm) Ndle 1 Units by Miscellaneous route daily. 100 each 2   ??? prednisoLONE acetate (PRED FORTE) 1 % ophthalmic suspension PLACE 1 DROP INTO THE RIGHT EYE 3 TIMES DAILY FOR 7 DAYS.     ??? semaglutide (OZEMPIC) 2 mg/dose (8 mg/3 mL) PnIj Inject 1 mg under the skin every seven (7) days. 9 mL 4   ??? testosterone cypionate (DEPOTESTOTERONE CYPIONATE) 100 mg/mL injection Inject 0.75 mL (75 mg total) into the muscle every fourteen (14) days. 4.5 mL 3     No current facility-administered medications for this visit.        Changes to medications: Chad Hines reports no changes at this time.    Allergies   Allergen Reactions   ??? Metronidazole Hives   ??? Atorvastatin Other (See Comments)     Memory  loss   ??? Ezetimibe Other (See Comments)     Memory loss   ??? Pitavastatin Other (See Comments)     Memory loss   ???  Statins-Hmg-Coa Reductase Inhibitors Other (See Comments)     Fatigue and myalgias as well as memory loss       Changes to allergies: No    SPECIALTY MEDICATION ADHERENCE     Praluent 75 mg/ml: 2 days of medicine on hand       Medication Adherence    Patient reported X missed doses in the last month: 0  Specialty Medication: Praluent 75mg /mL  Informant: patient          Specialty medication(s) dose(s) confirmed: Regimen is correct and unchanged.     Are there any concerns with adherence? No    Adherence counseling provided? Not needed    CLINICAL MANAGEMENT AND INTERVENTION      Clinical Benefit Assessment:    Do you feel the medicine is effective or helping your condition? Yes    Clinical Benefit counseling provided? Not needed    Adverse Effects Assessment:    Are you experiencing any side effects? No    Are you experiencing difficulty administering your medicine? No    Quality of Life Assessment     How many days over the past month did your hyperlipidemia  keep you from your normal activities? For example, brushing your teeth or getting up in the morning. 0    Have you discussed this with your provider? Not needed    Acute Infection Status:    Acute infections noted within Epic:  No active infections  Patient reported infection: None    Therapy Appropriateness:    Is therapy appropriate and patient progressing towards therapeutic goals? Yes, therapy is appropriate and should be continued    DISEASE/MEDICATION-SPECIFIC INFORMATION      For patients on injectable medications: Patient currently has 0 doses left.  Next injection is scheduled for 03/21/21.    PATIENT SPECIFIC NEEDS     - Does the patient have any physical, cognitive, or cultural barriers? No    - Is the patient high risk? No    - Does the patient require a Care Management Plan? No     SOCIAL DETERMINANTS OF HEALTH     At the [facility], we have learned that life circumstances - like trouble affording food, housing, utilities, or transportation can affect the health of many of our patients.   That is why we wanted to ask: are you currently experiencing any life circumstances that are negatively impacting your health and/or quality of life? Patient declined to answer    Social Determinants of Health     Food Insecurity: Not on file   Tobacco Use: Medium Risk   ??? Smoking Tobacco Use: Former   ??? Smokeless Tobacco Use: Never   ??? Passive Exposure: Not on file   Transportation Needs: Not on file   Alcohol Use: Not on file   Housing/Utilities: Not on file   Substance Use: Not on file   Financial Resource Strain: Not on file   Physical Activity: Not on file   Health Literacy: Low Risk    ??? : Never   Stress: Not on file   Intimate Partner Violence: Not on file   Depression: Not at risk   ??? PHQ-2 Score: 0   Social Connections: Not on file       Would you be willing to receive help with any of the needs that you have identified today? Not applicable       SHIPPING     Specialty Medication(s) to be Shipped:   General Specialty: Praluent  Other medication(s) to be shipped: No additional medications requested for fill at this time     Changes to insurance: Yes: New insurance on file. Approved plus copay card makes $25 per 28 day copay. Pt approves copay.    Delivery Scheduled: Yes, Expected medication delivery date: 03/20/21.     Medication will be delivered via Same Day Courier to the confirmed prescription address in [facility].    The patient will receive a drug information handout for each medication shipped and additional FDA Medication Guides as required.  Verified that patient has previously received a Conservation officer, historic buildings and a Surveyor, mining.    The patient or caregiver noted above participated in the development of this care plan and knows that they can request review of or adjustments to the care plan at any time.      All of the patient's questions and concerns have been addressed.    [name]   [facility] Specialty Pharmacist

## 2021-03-20 MED FILL — PRALUENT PEN 75 MG/ML SUBCUTANEOUS PEN INJECTOR: SUBCUTANEOUS | 28 days supply | Qty: 2 | Fill #1

## 2021-03-27 DIAGNOSIS — E291 Testicular hypofunction: Principal | ICD-10-CM

## 2021-03-27 MED ORDER — TESTOSTERONE CYPIONATE 100 MG/ML INTRAMUSCULAR OIL
INTRAMUSCULAR | 1 refills | 84 days | Status: CP
Start: 2021-03-27 — End: 2021-06-25

## 2021-04-08 ENCOUNTER — Ambulatory Visit: Admit: 2021-04-08 | Discharge: 2021-04-08 | Payer: PRIVATE HEALTH INSURANCE | Attending: Medical | Primary: Medical

## 2021-04-08 DIAGNOSIS — R197 Diarrhea, unspecified: Principal | ICD-10-CM

## 2021-04-08 LAB — COMPREHENSIVE METABOLIC PANEL
ALBUMIN: 3.8 g/dL (ref 3.4–5.0)
ALKALINE PHOSPHATASE: 84 U/L (ref 46–116)
ALT (SGPT): 26 U/L (ref 10–49)
ANION GAP: 8 mmol/L (ref 5–14)
AST (SGOT): 22 U/L (ref <=34)
BILIRUBIN TOTAL: 0.9 mg/dL (ref 0.3–1.2)
BLOOD UREA NITROGEN: 15 mg/dL (ref 9–23)
BUN / CREAT RATIO: 14
CALCIUM: 9.8 mg/dL (ref 8.7–10.4)
CHLORIDE: 103 mmol/L (ref 98–107)
CO2: 27 mmol/L (ref 20.0–31.0)
CREATININE: 1.08 mg/dL
EGFR CKD-EPI (2021) MALE: 77 mL/min/{1.73_m2} (ref >=60)
GLUCOSE RANDOM: 122 mg/dL (ref 70–179)
POTASSIUM: 4.2 mmol/L (ref 3.4–4.8)
PROTEIN TOTAL: 7 g/dL (ref 5.7–8.2)
SODIUM: 138 mmol/L (ref 135–145)

## 2021-04-08 LAB — CBC W/ AUTO DIFF
BASOPHILS ABSOLUTE COUNT: 0.1 10*9/L (ref 0.0–0.1)
BASOPHILS RELATIVE PERCENT: 0.6 %
EOSINOPHILS ABSOLUTE COUNT: 1.5 10*9/L — ABNORMAL HIGH (ref 0.0–0.5)
EOSINOPHILS RELATIVE PERCENT: 8.4 %
HEMATOCRIT: 51.8 % — ABNORMAL HIGH (ref 39.0–48.0)
HEMOGLOBIN: 17.9 g/dL — ABNORMAL HIGH (ref 12.9–16.5)
LYMPHOCYTES ABSOLUTE COUNT: 2.1 10*9/L (ref 1.1–3.6)
LYMPHOCYTES RELATIVE PERCENT: 12 %
MEAN CORPUSCULAR HEMOGLOBIN CONC: 34.5 g/dL (ref 32.0–36.0)
MEAN CORPUSCULAR HEMOGLOBIN: 30.7 pg (ref 25.9–32.4)
MEAN CORPUSCULAR VOLUME: 88.9 fL (ref 77.6–95.7)
MEAN PLATELET VOLUME: 8.6 fL (ref 6.8–10.7)
MONOCYTES ABSOLUTE COUNT: 1.4 10*9/L — ABNORMAL HIGH (ref 0.3–0.8)
MONOCYTES RELATIVE PERCENT: 7.6 %
NEUTROPHILS ABSOLUTE COUNT: 12.7 10*9/L — ABNORMAL HIGH (ref 1.8–7.8)
NEUTROPHILS RELATIVE PERCENT: 71.4 %
NUCLEATED RED BLOOD CELLS: 0 /100{WBCs} (ref <=4)
PLATELET COUNT: 297 10*9/L (ref 150–450)
RED BLOOD CELL COUNT: 5.82 10*12/L — ABNORMAL HIGH (ref 4.26–5.60)
RED CELL DISTRIBUTION WIDTH: 13.4 % (ref 12.2–15.2)
WBC ADJUSTED: 17.8 10*9/L — ABNORMAL HIGH (ref 3.6–11.2)

## 2021-04-08 NOTE — Unmapped (Signed)
Name:  Chad Hines  DOB: [birth year]  Today's Date: 04/08/2021  Age:  66 y.o.    A/P:  Problem List Items Addressed This Visit        Other    Diarrhea - Primary     Patient reports intermittent diarrhea x 2 weeks, worse over past 6 days as he has been experiencing diarrhea hourly. Notes having 6-10 episodes of diarrhea daily. Symptoms worse at night. He had 24 hours of vomiting 6 days ago, but this has resolved. Patient visit mother in nursing home in [place] before symptoms onset. Denies recent international travel, abnormal food intake, recent antibiotic use, hematochezia/melena, nausea and vomiting, abdominal pain (unless actively passing diarrhea), significant chest pain or shortness of breath, or lightheadedness/dizziness. Home Covid test on Saturday (04/05/21) was negative. History of diverticulitis and partial colectomy. Patient stopped all medications when symptoms worsened.  - Ordered stool cultures//GI pathogen panel and C.diff testing. Patient will turn in at the hospital.    - Labs ordered as below  - Advised patient restart ASA and Plavix 75 mg daily, carvedilol 3.125 mg BID, lisinopril 10 mg daily. Ok to hold metformin, Ozempic and Lasix in setting of acute illness. Pt also holding alirocumab inj as this was associated with diarrhea previously. Encouraged to restart once symptoms resolved.  - Strongly encouraged patient to push fluids with electrolytes. Tolerating solids ok- was able to eat chicken and rice soup yesterday. Considered empiric antibiotics, but ultimately deferred.   - Ordered Covid swab today  - Reviewed red flag symptoms of when patient should present to ER  - Follow up in 2 days, reconsider empiric antibiotics at that time.          Relevant Orders    COVID-19 PCR    GI Pathogen Panel    C. Difficile Assay    POCT urinalysis dipstick (Completed)    CBC w/ Differential    Comprehensive metabolic panel     Medication adherence and barriers to the treatment plan have been addressed. Opportunities to optimize healthy behaviors have been discussed. Patient / caregiver voiced understanding.      Return in about 2 days (around 04/10/2021) for Recheck diarrhea .    S:  Chad Hines is a 66 y.o. male who presents for acute sick visit    Vomiting and diarrhea   Patient presents with 6 days of vomiting and diarrhea. 2 week ago (Thursday night) patient had diarrhea every hour after he was returning from visiting mother in nursing home. Symptoms improved Monday and he was able to go back to work. 1 week ago (Thursday) patient had diarrhea hourly again and 24 hours of vomiting. For the past 6 days, he continues to experience diarrhea hourly. Diarrhea worse at night when he lays down and sleeping. He has been trying to stay hydrated. Feels very weak. Patient took Pepto bismal but has not taken Imodium. Denies recent international travel, abnormal food intake, antibiotics, hematochezia, nausea and vomiting, abdominal pain (unless actively passing diarrhea). He stopped all medications last week as he has been feeling under the weather. History of diverticulitis. Previously noticed diarrhea the day following his alirocumab every 2 weeks. Increased Ozempic dose from 0.5 --> 1 mg weekly ~3 weeks ago, patient didn't notice SE after increase. Home Covid test on Saturday (04/05/21) was negative.     Ankle pain   Patient also endorsing b/l ankle pain. Exam normal.     ROS:  A 12 point review of systems was negative  except for pertinent items noted in the HPI.    Past medical history, family history, surgical history and social history personally reviewed and verified with patient.     PROBLEM LIST  Patient Active Problem List   Diagnosis   ??? History of MI (myocardial infarction)   ??? 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 Essential hypertension   ??? Hyperlipidemia   ??? Myositis   ??? Old myocardial infarction   ??? S/P coronary artery bypass graft x 4 ??? Severe obstructive sleep apnea   ??? Annual physical exam   ??? Diabetes mellitus (CMS-HCC)   ??? Fatigue   ??? Hypogonadism male   ??? Ocular ischemic syndrome   ??? Body mass index (BMI)40.0-44.9, adult   ??? Screening for colon cancer   ??? Stress   ??? Class 2 obesity in adult   ??? CVA (cerebral vascular accident) (CMS-HCC)   ??? RLS (restless legs syndrome)   ??? Right foot pain   ??? Pain and swelling of right ankle   ??? Health care maintenance   ??? Memory difficulties   ??? DOE (dyspnea on exertion)   ??? Diarrhea       O:  Vitals:    04/08/21 0947   BP: 120/90   Pulse: 84   Temp: 36.6 ??C (97.8 ??F)   SpO2: 98%     General appearance: No acute distress, well appearing and well nourished.   HEENT: Normocephalic, atraumatic.  NECK: Supple, symmetric, trachea midline, no masses. No lymphadenopathy. No thyromegaly or masses.  HEART: RRR w/ normal S1 and S2. No MRG. DP and PT pulses  2+ bilaterally. No peripheral edema or varicosities.  LUNGS: No signs of respiratory distress. CTA all fields. No wheezes, rales, rhonchi or crackles.   ABDOMEN: Non-tender, no masses. Normoactive BS in all 4 quadrants. + positive borborygmi  MSK: Normal ROM.   SKIN: Warm, dry, intact. No suspicious lesions noted on face, or extremeties.    NEURO: Cranial nerves II-XII grossly intact.   PSYCH: A&O x 3. Affect normal. Judgment, insight normal. Makes good eye contact. Smiles appropriately. Well-kempt.    I attest that I, Katheran Awe Rindoks, personally documented this note while acting as scribe for [employer], PAC.      Cassidy E Rindoks, Scribe.  04/08/2021     The documentation recorded by the scribe accurately reflects the service I personally performed and the decisions made by me.    Kegan Shepardson A Shlok Raz, PAC grossly intact.   PSYCH: A&O x 3. Affect normal. Judgment, insight normal. M    I attest that I, Rodman Pickle E Rindoks, personally documented this note while acting as scribe for [employer], PAC.      Cassidy E Rindoks, Scribe.  04/08/2021     The documentation recorded by the scribe accurately reflects the service I personally performed and the decisions made by me.    Dodi Leu A Iyari Hagner, PAC

## 2021-04-08 NOTE — Unmapped (Addendum)
Patient reports intermittent diarrhea x 2 weeks, worse over past 6 days as he has been experiencing frequent diarrhea. Notes having 6-10 episodes of diarrhea daily. Symptoms worse at night. He had 24 hours of vomiting 6 days ago, but this has resolved. Patient visit mother in nursing home in [place] before symptoms onset. Denies recent international travel, abnormal food intake, recent antibiotic use, fever, chills, hematochezia/melena, nausea and vomiting, abdominal pain (unless actively passing diarrhea), significant chest pain or shortness of breath, or lightheadedness/dizziness. Home Covid test on Saturday (04/05/21) was negative. History of diverticulitis and partial colectomy. Patient stopped all medications when symptoms worsened.  - Ordered stool cultures//GI pathogen panel and C.diff testing. Patient will turn in at the hospital.    - Labs ordered as below  - Advised patient restart ASA and Plavix 75 mg daily, carvedilol 3.125 mg BID, lisinopril 10 mg daily.Keeping an eye on BP. Ok to hold metformin, Ozempic and Lasix in setting of acute illness. Pt also holding alirocumab inj as this was associated with diarrhea previously. Encouraged to restart once symptoms resolved.  - Strongly encouraged patient to push fluids with electrolytes. Tolerating solids ok- was able to eat chicken and rice soup yesterday. Considered empiric antibiotics, but ultimately deferred until c.diff testing back   - Ordered Covid swab today  - Reviewed red flag symptoms of when patient should present to ER  - Follow up in 2 days, reconsider empiric antibiotics at that time.     Addendum: c.diff neg, elevated WBC count with predominance of neutrophils, lytes reassuring called pt 04/09/21. Pt noted improvement in diarrhea only 2 episodes overnight. No abd pain, F/C. Back up Rx for azithromycin provided. Ok to use loperamide or Pepto-bismol as discussed.  Lab Results   Component Value Date    WBC 17.8 (H) 04/08/2021    RBC 5.82 (H) 04/08/2021 HGB 17.9 (H) 04/08/2021    HCT 51.8 (H) 04/08/2021    MCV 88.9 04/08/2021    MCH 30.7 04/08/2021    MCHC 34.5 04/08/2021    RDW 13.4 04/08/2021    PLT 297 04/08/2021    MPV 8.6 04/08/2021

## 2021-04-09 MED ORDER — AZITHROMYCIN 500 MG TABLET
ORAL_TABLET | Freq: Every day | ORAL | 0 refills | 3.00000 days | Status: CP
Start: 2021-04-09 — End: 2021-04-12

## 2021-04-10 NOTE — Unmapped (Unsigned)
05/08/21 Pt stated he had gotten sick and still have an injection or injections on hand and to please call him back on 04/24/21. sed

## 2021-04-11 NOTE — Unmapped (Signed)
Name:  Chad Hines  DOB: 02/24/1956  Today's Date: 04/14/2021  Age:  66 y.o.    A/P:  Problem List Items Addressed This Visit        Other    Diarrhea - Primary     History of intermittent diarrhea x weeks, reported symptoms have improved since last OV. Now having 2-3 soft stools daily. Denies nausea and vomiting. Last episode of diarrhea 3 days ago.   - Recommended patient restart metformin.   - Advised he message cardio RE alirocumab - discussed diarrhea documented SE in approx 5% of pts         Relevant Orders    Comprehensive Metabolic Panel (Completed)    CBC w/ Differential (Completed)    Pain and swelling of ankle     Patient presents with b/l ankle pain and swelling L>R. 1-2+ pitting edema on exam with no significant erythema or warmth to touch. Mild TTP. Pain worse with movement and symptoms worse since pt was seen last week for acute GI illness; hx of b/l ankle swelling R>L. No prior history of blood clots (on ASA 325/Plavix for hx of CAD). No known family history of RA.  Wells score 0 today, low suspicion for DVT.  - Checking labs as below, ultimately ordered d-dimer given asymmetry, but overall lower suspicion   - Continue knee-high compression socks, continue elevation.   - XR ankle L and XR foot L ordered today   - Consider referral to ortho vs vascular for future eval.   - Advised patient try Tylenol 650 mg PRN for pain relief.   - FU up with cardio as scheduled, cath completed 02/04/21 showed LVEF 50-55%; no recent echo on file. Pt restarted Lasix 40 mg qd 3 days ago (held in the setting of acute illness/diarrhea/vomiting)     Addendum: D-dimer returned elevated. PVL ordered         Relevant Orders    Uric acid (Completed)    Pro-BNP    D-Dimer, Quantitative (Completed)    XR Foot 3 Or More Views Left (Completed)    XR Ankle 3 Views Left (Completed)    Anti-Nuclear Antibody (ANA)    Rheumatoid Factor    Cyclic Citrul Peptide Antibody, IgG    TSH (Completed)    PVL Venous Duplex Lower Extremity Left     Medication adherence and barriers to the treatment plan have been addressed. Opportunities to optimize healthy behaviors have been discussed. Patient / caregiver voiced understanding.    No follow-ups on file.    S:  Chad Hines is a 66 y.o. male who presents for follow up. Last seen by this PCP 04/08/21 for diarrhea. Ordered GI pathogen panel with C.diff testing, recommended patient restart ASA and Plavix, carvedilol, lisinopril as he has self discontinued. Ok'd holding Ozempic, metformin given reasonable BS and acute illness. Ok'd holding Lasix given profuse diarrhea to avoid overdiuresis. C. Diff was negative, labs indicated elevated WBC.  Back up Rx for azithromycin provided. Ok to use loperamide or Pepto-bismol. GI pathogen panel returned negative. Today pt reports symptoms improved. Home BS - 120-140 fasting   Hypertension - home BP 120s/90. Today at triage 147/98.     Diarrhea   Patient is having soft stools 2-3 times daily. Improved since last OV. He did not start the azithromycin. Denies nausea and vomiting. Friday was the last episode of diarrhea per patient. He has been off of alirocumab for 4 weeks as she feels this likely contributed to  diarrhea.     Ankle swelling and pain   Patient reports b/l ankle swelling L>R, and L ankle painful 6/10, worse with movement. He is taking Lasix. Wearing compression socks and is elevating his feet. History of intermittent R ankle pain. No history of ankle or foot surgery. No history of prior blood clot. Denies CP, CT, SOB, no tachycardia. Restarted lasix 3 days ago.  No family history of known autoimmune diease. He has been taking NSAIDs a few times daily for ankle pain.       ROS:  A 12 point review of systems was negative except for pertinent items noted in the HPI.    Past medical history, family history, surgical history and social history personally reviewed and verified with patient.     PROBLEM LIST  Patient Active Problem List   Diagnosis   ??? History of MI (myocardial infarction)   ??? 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 Essential hypertension   ??? Hyperlipidemia   ??? Myositis   ??? Old myocardial infarction   ??? S/P coronary artery bypass graft x 4   ??? Severe obstructive sleep apnea   ??? Annual physical exam   ??? Diabetes mellitus (CMS-HCC)   ??? Fatigue   ??? Hypogonadism male   ??? Ocular ischemic syndrome   ??? Body mass index (BMI)40.0-44.9, adult   ??? Screening for colon cancer   ??? Stress   ??? Class 2 obesity in adult   ??? CVA (cerebral vascular accident) (CMS-HCC)   ??? RLS (restless legs syndrome)   ??? Right foot pain   ??? Pain and swelling of right ankle   ??? Health care maintenance   ??? Memory difficulties   ??? DOE (dyspnea on exertion)   ??? Diarrhea   ??? Pain and swelling of ankle       O:  Vitals:    04/14/21 1020   BP: 144/96   Pulse:    Temp:      General appearance: No acute distress, well appearing and well nourished.   HEENT: Normocephalic, atraumatic  NECK: Supple, symmetric, trachea midline, no masses. No lymphadenopathy. No thyromegaly or masses.  HEART: RRR w/ normal S1 and S2. No MRG. DP and PT pulses  2+ bilaterally. No varicosities.   LUNGS: No signs of respiratory distress. CTA all fields. No wheezes, rales, rhonchi or crackles.   MSK: Gait slightly antalgic. Normal ROM. Strength normal, equal bilaterally. Foot:  1-2+ pitting edema over posterior medial malleolus L>R. 1+ pitting edema over dorsal L mid foot. No significant erythema. Pulses brisk and symmetric. Mild TTP of 1st MTP. Pain with resisted extension in L 1st toe Pain with resisted eversion in bilateral ankles, otherwise ROM WNL.  Patient able to walk on heels. Some difficulty with toe walk 2/2 pain. NVI  SKIN: Warm, dry, intact.   NEURO: Cranial nerves II-XII grossly intact. Reflexes 2+ and symmetric. No sensory loss.   PSYCH: A&O x 3. Affect normal. Judgment, insight normal. Makes good eye contact. Smiles appropriately. Well-kempt.    I attest that I, Katheran Awe Rindoks, personally documented this note while acting as scribe for [employer], PAC.      Cassidy E Rindoks, Scribe.  04/14/2021     The documentation recorded by the scribe accurately reflects the service I personally performed and the decisions made by me.    Darbi Chandran A Pharaoh Pio, PAC

## 2021-04-14 ENCOUNTER — Ambulatory Visit: Admit: 2021-04-14 | Discharge: 2021-04-15 | Payer: PRIVATE HEALTH INSURANCE | Attending: Medical | Primary: Medical

## 2021-04-14 ENCOUNTER — Ambulatory Visit: Admit: 2021-04-14 | Discharge: 2021-04-15 | Payer: PRIVATE HEALTH INSURANCE

## 2021-04-14 DIAGNOSIS — M25473 Effusion, unspecified ankle: Principal | ICD-10-CM

## 2021-04-14 DIAGNOSIS — R197 Diarrhea, unspecified: Principal | ICD-10-CM

## 2021-04-14 DIAGNOSIS — M25579 Pain in unspecified ankle and joints of unspecified foot: Principal | ICD-10-CM

## 2021-04-14 LAB — TSH: THYROID STIMULATING HORMONE: 1.633 u[IU]/mL (ref 0.550–4.780)

## 2021-04-14 LAB — CBC W/ AUTO DIFF
BASOPHILS ABSOLUTE COUNT: 0.1 10*9/L (ref 0.0–0.1)
BASOPHILS RELATIVE PERCENT: 1.2 %
EOSINOPHILS ABSOLUTE COUNT: 2 10*9/L — ABNORMAL HIGH (ref 0.0–0.5)
EOSINOPHILS RELATIVE PERCENT: 20.2 %
HEMATOCRIT: 46.7 % (ref 39.0–48.0)
HEMOGLOBIN: 16.1 g/dL (ref 12.9–16.5)
LYMPHOCYTES ABSOLUTE COUNT: 2 10*9/L (ref 1.1–3.6)
LYMPHOCYTES RELATIVE PERCENT: 20.7 %
MEAN CORPUSCULAR HEMOGLOBIN CONC: 34.4 g/dL (ref 32.0–36.0)
MEAN CORPUSCULAR HEMOGLOBIN: 30.5 pg (ref 25.9–32.4)
MEAN CORPUSCULAR VOLUME: 88.6 fL (ref 77.6–95.7)
MEAN PLATELET VOLUME: 7.2 fL (ref 6.8–10.7)
MONOCYTES ABSOLUTE COUNT: 0.8 10*9/L (ref 0.3–0.8)
MONOCYTES RELATIVE PERCENT: 7.7 %
NEUTROPHILS ABSOLUTE COUNT: 4.9 10*9/L (ref 1.8–7.8)
NEUTROPHILS RELATIVE PERCENT: 50.2 %
NUCLEATED RED BLOOD CELLS: 0 /100{WBCs} (ref <=4)
PLATELET COUNT: 321 10*9/L (ref 150–450)
RED BLOOD CELL COUNT: 5.27 10*12/L (ref 4.26–5.60)
RED CELL DISTRIBUTION WIDTH: 13.2 % (ref 12.2–15.2)
WBC ADJUSTED: 9.8 10*9/L (ref 3.6–11.2)

## 2021-04-14 LAB — COMPREHENSIVE METABOLIC PANEL
ALBUMIN: 3.6 g/dL (ref 3.4–5.0)
ALKALINE PHOSPHATASE: 64 U/L (ref 46–116)
ALT (SGPT): 20 U/L (ref 10–49)
ANION GAP: 8 mmol/L (ref 5–14)
AST (SGOT): 21 U/L (ref <=34)
BILIRUBIN TOTAL: 0.6 mg/dL (ref 0.3–1.2)
BLOOD UREA NITROGEN: 13 mg/dL (ref 9–23)
BUN / CREAT RATIO: 15
CALCIUM: 10.1 mg/dL (ref 8.7–10.4)
CHLORIDE: 104 mmol/L (ref 98–107)
CO2: 30.7 mmol/L (ref 20.0–31.0)
CREATININE: 0.89 mg/dL
EGFR CKD-EPI (2021) MALE: >90 mL/min/{1.73_m2} (ref >=60)
GLUCOSE RANDOM: 103 mg/dL (ref 70–179)
POTASSIUM: 4.3 mmol/L (ref 3.4–4.8)
PROTEIN TOTAL: 6.8 g/dL (ref 5.7–8.2)
SODIUM: 143 mmol/L (ref 135–145)

## 2021-04-14 LAB — URIC ACID: URIC ACID: 7.8 mg/dL

## 2021-04-14 LAB — RHEUMATOID FACTOR, QUANT: RHEUMATOID FACTOR: <3.5 [IU]/mL (ref <14.0)

## 2021-04-14 LAB — D-DIMER, QUANTITATIVE: D-DIMER QUANTITATIVE (CW,ML,HL,HS,CH,JS,JC,RX): 981 ng{FEU}/mL — ABNORMAL HIGH (ref <=500)

## 2021-04-14 NOTE — Unmapped (Addendum)
Patient presents with b/l ankle pain and swelling L>R. 1-2+ pitting edema on exam with no significant erythema or warmth to touch. Mild TTP. Pain worse with movement and symptoms worse since pt was seen last week for acute GI illness; hx of b/l ankle swelling R>L. No prior history of blood clots (on ASA 325/Plavix for hx of CAD). No known family history of RA.  Wells score 0 today, low suspicion for DVT.  - Checking labs as below, ultimately ordered d-dimer given asymmetry, but overall lower suspicion   - Continue knee-high compression socks, continue elevation.   - XR ankle L and XR foot L ordered today   - Consider referral to ortho vs vascular for future eval.   - Advised patient try Tylenol 650 mg PRN for pain relief.   - FU up with cardio as scheduled, cath completed 02/04/21 showed LVEF 50-55%; no recent echo on file. Pt restarted Lasix 40 mg qd 3 days ago (held in the setting of acute illness/diarrhea/vomiting)     Addendum: D-dimer returned elevated. PVL ordered

## 2021-04-14 NOTE — Unmapped (Addendum)
History of intermittent diarrhea x weeks, reported symptoms have improved since last OV. Now having 2-3 soft stools daily. Denies nausea and vomiting. Last episode of diarrhea 3 days ago.   - Recommended patient restart metformin.   - Advised he message cardio RE alirocumab - discussed diarrhea documented SE in approx 5% of pts

## 2021-04-15 LAB — ANA: ANTINUCLEAR ANTIBODIES (ANA): NEGATIVE

## 2021-04-16 LAB — CYCLIC CITRUL PEPTIDE ANTIBODY, IGG
CCP ANTIBODIES: <1 {ELISA'U} (ref <7.0)
CCP IGG ANTIBODIES: NEGATIVE

## 2021-04-16 LAB — PRO-BNP: PRO-BNP: 238 pg/mL — ABNORMAL HIGH (ref 0.0–125.0)

## 2021-04-24 ENCOUNTER — Ambulatory Visit: Admit: 2021-04-24 | Discharge: 2021-04-25 | Payer: PRIVATE HEALTH INSURANCE

## 2021-04-24 NOTE — Unmapped (Signed)
[facility] Refill Coordination Note    Specialty Medication(s) to be Shipped:   General Specialty: Praluent    Other medication(s) to be shipped: No additional medications requested for fill at this time     Chad Hines, DOB: [birth year]  Phone: [phone] (home)       All above HIPAA information was verified with patient.     Was a Nurse, learning disability used for this call? No    Completed refill call assessment today to schedule patient's medication shipment from the [facility] [phone]).  All relevant notes have been reviewed.     Specialty medication(s) and dose(s) confirmed: Regimen is correct and unchanged.   Changes to medications: Monterrio reports no changes at this time.  Changes to insurance: No  New side effects reported not previously addressed with a pharmacist or physician: None reported  Questions for the pharmacist: No    Confirmed patient received a Conservation officer, historic buildings and a Surveyor, mining with first shipment. The patient will receive a drug information handout for each medication shipped and additional FDA Medication Guides as required.       DISEASE/MEDICATION-SPECIFIC INFORMATION        For patients on injectable medications: Patient currently has 1 doses left.  Next injection is scheduled for 05/23/21.    SPECIALTY MEDICATION ADHERENCE     Medication Adherence    Patient reported X missed doses in the last month: 0  Specialty Medication: Praluent 75mg /ml  Patient is on additional specialty medications: No  Patient is on more than two specialty medications: No              Were doses missed due to medication being on hold? No    Praluent 75 mg/ml: 1 days of medicine on hand       REFERRAL TO PHARMACIST     Referral to the pharmacist: Not needed      [facility]     Shipping address confirmed in Epic.     Delivery Scheduled: Yes, Expected medication delivery date: 05/02/21.     Medication will be delivered via Same Day Courier to the prescription address in Epic WAM.    Nancy Nordmann [facility] Specialty Technician

## 2021-05-02 MED FILL — PRALUENT PEN 75 MG/ML SUBCUTANEOUS PEN INJECTOR: SUBCUTANEOUS | 28 days supply | Qty: 2 | Fill #2

## 2021-05-06 MED ORDER — CARVEDILOL 6.25 MG TABLET
ORAL_TABLET | Freq: Two times a day (BID) | ORAL | 3 refills | 90 days | Status: CP
Start: 2021-05-06 — End: ?

## 2021-05-08 NOTE — Unmapped (Signed)
Chad Hines  05/08/2021 11:28:53 AM  [facility] REFERRALS TO BE SCHEDULED  05/08/2021 Called patient lmom to call back to schedule a GI Consult with Dr. Mohammed Kindle...emcadams    Exp[employer]      [name], LPN  03/07/1094 04:54:09 AM  [facility] REFERRALS TO BE SCHEDULED  05/08/21 Schedule consult with Dr. Mohammed Kindle    [facility]      Loading notes...  Close

## 2021-05-13 ENCOUNTER — Ambulatory Visit
Admit: 2021-05-13 | Discharge: 2021-05-14 | Payer: PRIVATE HEALTH INSURANCE | Attending: Internal Medicine | Primary: Internal Medicine

## 2021-05-13 DIAGNOSIS — R197 Diarrhea, unspecified: Principal | ICD-10-CM

## 2021-05-13 DIAGNOSIS — R1030 Lower abdominal pain, unspecified: Principal | ICD-10-CM

## 2021-05-13 MED ORDER — HYOSCYAMINE SULFATE 0.125 MG TABLET
ORAL_TABLET | Freq: Four times a day (QID) | ORAL | 0 refills | 15 days | Status: CP | PRN
Start: 2021-05-13 — End: ?

## 2021-05-13 NOTE — Unmapped (Signed)
Persistent diarrhea and abdominal pain since late January with sudden onset of vomiting with nonbloody diarrhea.  Vomiting resolved and diarrhea is not as bad (but still have some).      Recent negative GI pathogen screen and C. difficile toxin.  Last colonoscopy in [place] over 5 years ago (records not available).      Recommend CT-scan of abdomen and pelvis and colonoscopy.  Discussed risks of procedure and sedation.  Patient is on Plavix for CAD with prior CABG and PCI to LCX and also have obstructive sleep apnea and s/p partial colectomy for complicated diverticulitis with perforation.

## 2021-05-13 NOTE — Unmapped (Signed)
colonoscopy to rule out colitis (need to contact cardiologist regarding plavix).  CT-scan of abdomen and pelvis   hyoscyamine as needed for abdominal pain   Probiotics  dietary modification   hydrogen breath test for small intestinal bacterial overgrowth

## 2021-05-13 NOTE — Unmapped (Signed)
GASTROENTEROLOGY OFFICE NOTE    Reason for visit:  diarrhea and abdominal pain     PCP: Hillary A Tester, PAC    Informant: Patient came to appointment alone.    Assessment/Plan:      Problem List Items Addressed This Visit     Diarrhea     Persistent diarrhea and abdominal pain since late January with sudden onset of vomiting with nonbloody diarrhea.  Vomiting resolved and diarrhea is not as bad (but still have some).      Recent negative GI pathogen screen and C. difficile toxin.  Last colonoscopy in [place] over 5 years ago (records not available).      Recommend CT-scan of abdomen and pelvis and colonoscopy.  Discussed risks of procedure and sedation.  Patient is on Plavix for CAD with prior CABG and PCI to LCX and also have obstructive sleep apnea and s/p partial colectomy for complicated diverticulitis with perforation.           Relevant Orders    Hydrogen Breath Test    Lower abdominal pain - Primary    Relevant Orders    CT Abdomen Pelvis W Contrast    Hydrogen Breath Test       Subjective:   Patient ID:  Chad Hines is a 66 y.o. male     History of Present Illness:      Late January (was with her mother who was in hospice), had 7-day of violent gas and non-bloody diarrhea with abdominal pain. Had vomiting x 2 days at onset of episode. Better but still intermittent diarrhea.  ~2 bowel movements daily with loose-liquid stool.  Only had formed stool 2 days ago and yesterday.  No melena, hematochezia.  Had vomiting x    Usually have formed stool.      Scheduled for colonoscopy in [place] ([facility] in Dec 2022) but patient cancelled (due to wife having perforation)    Last colonoscopy ()-several years ago (patient unable to recall exactly which year and do have records).  Normal per patient.  Past Medical History:   Diagnosis Date   ??? Arthritis    ??? Body mass index (BMI)40.0-44.9, adult 02/22/2019   ??? CAD (coronary artery disease)    ??? Cataract    ??? CVA (cerebral vascular accident) (CMS-HCC) 03/27/2020   ??? Diabetes mellitus (CMS-HCC) 11/25/2018   ??? HTN (hypertension)    ??? Hyperlipemia    ??? Myocardial infarction (CMS-HCC) 2015    x 3   ??? Myositis    ??? OSA (obstructive sleep apnea)     not on CPAP or bipap       Past Surgical History:   Procedure Laterality Date   ??? CARDIAC CATHETERIZATION  12/2017    Native 3V CAD, occluded SVG's x 3; patent LIMA-LAD, patent stent native LAD post LIMA, patent LCX stents;  no targets for PCI   ??? COLON SURGERY  2013    [place].  For perforated diverticulitis-records not avail   ??? CORONARY ARTERY BYPASS GRAFT  2011    4V LIMA-LAD, SVG-RCA, SVG-OM, SVG-unknown vessel in [place]   ??? CORONARY STENT PLACEMENT  2012    Stent to native LAD post LIMA insertion   ??? EYE SURGERY      Cataracts removed   ??? HERNIA REPAIR     ??? PR CATH PLACE/CORON ANGIO, IMG SUPER/INTERP,W LEFT HEART VENTRICULOGRAPHY N/A 02/04/2021    Procedure: LEFT HEART CATHETERIZATION WITH POSSIBLE REVASCULARIZATION;  Surgeon: Jacqualyn Posey, MD;  Location: [facility];  Service: Cardiology   ??? PR KNEE SCOPE,ABRASN ARTHROPLASTY Left 01/08/2014    Procedure: ARTHROSCOPY, KNEE, SURG; ABRASION ARTHROPLASTY (W/CHONDROPLASTY AS NEEDED) OR MULTIPLE DRILLING/MICROFRACT;  Surgeon: Orrin Brigham, MD;  Location: [facility] OR [facility];  Service: Orthopedics   ??? PR SHLDR ARTHROSCOP,SURG,W/ROTAT CUFF REPR Left 04/01/2020    Procedure: ARTHROSCOPY, SHOULDER, SURGICAL; WITH ROTATOR CUFF REPAIR;  Surgeon: Tomasa Rand, MD;  Location: [facility];  Service: Orthopedics   ??? REFRACTIVE SURGERY     ??? SPINE SURGERY     ??? VASECTOMY         Medications were reviewed with the patient and include:  Current Outpatient Medications   Medication Sig Dispense Refill   ??? aspirin 325 MG tablet Take 1 tablet (325 mg total) by mouth daily.     ??? blood sugar diagnostic (ONETOUCH ULTRA TEST) Strp CHECK BLOOD SUGAR AS DIRECTED ONCE A DAY AND FOR SYMPTOMS OF HIGH OR LOW BLOOD SUGAR. 100 strip 1   ??? blood-glucose meter kit Use as instructed 1 each 0   ??? carvediloL (COREG) 6.25 MG tablet Take 1 tablet (6.25 mg total) by mouth Two (2) times a day. 180 tablet 3   ??? clopidogrel (PLAVIX) 75 mg tablet Take 1 tablet (75 mg total) by mouth daily.     ??? empty container (SHARPS-A-GATOR DISPOSAL SYSTEM) Misc Use as directed for sharps disposal 1 each 2   ??? EPINEPHrine (EPIPEN) 0.3 mg/0.3 mL injection Inject 0.3 mL (0.3 mg total) into the muscle.     ??? furosemide (LASIX) 40 MG tablet Take 1 tablet (40 mg total) by mouth daily. 30 tablet 6   ??? ibuprofen (ADVIL,MOTRIN) 200 MG tablet Take 2 tablets (400 mg total) by mouth every eight (8) hours as needed for fever.     ??? lancets (ONETOUCH DELICA LANCETS) 33 gauge Misc CHECK BLOOD SUGAR AS DIRECTED ONCE A DAY AND FOR SYMPTOMS OF HIGH OR LOW BLOOD SUGAR. 100 each 1   ??? lisinopriL (PRINIVIL,ZESTRIL) 10 MG tablet Take 1 tablet (10 mg total) by mouth daily. 90 tablet 3   ??? metFORMIN (GLUCOPHAGE-XR) 500 MG 24 hr tablet Take 2 tablets (1,000 mg total) by mouth 2 (two) times a day with meals. 360 tablet 3   ??? multivitamin (TAB-A-VITE/THERAGRAN) per tablet Take 1 tablet by mouth daily.     ??? nitroglycerin (NITROSTAT) 0.4 MG SL tablet Place 1 tablet (0.4 mg total) under the tongue every five (5) minutes as needed for chest pain.     ??? OZEMPIC 0.25 mg or 0.5 mg(2 mg/1.5 mL) PnIj injection      ??? pen needle, diabetic (NOVOFINE 32) 32 gauge x 1/4 (6 mm) Ndle 1 Units by Miscellaneous route daily. 100 each 2   ??? semaglutide (OZEMPIC) 2 mg/dose (8 mg/3 mL) PnIj Inject 1 mg under the skin every seven (7) days. 9 mL 4   ??? testosterone cypionate (DEPOTESTOTERONE CYPIONATE) 100 mg/mL injection Inject 1 mL (100 mg total) into the muscle every fourteen (14) days. 6 mL 1   ??? alirocumab 75 mg/mL PnIj Inject the contents of one pen (75 mg) under the skin every fourteen (14) days. (Patient not taking: Reported on 05/13/2021) 6 mL 3   ??? hyoscyamine (LEVSIN) 0.125 mg tablet Take 1 tablet (0.125 mg total) by mouth every six (6) hours as needed for cramping. 60 tablet 0   ??? prednisoLONE acetate (PRED FORTE) 1 % ophthalmic suspension  (Patient not taking: Reported on 05/13/2021)  No current facility-administered medications for this visit.        Allergies   Allergen Reactions   ??? Metronidazole Hives   ??? Atorvastatin Other (See Comments)     Memory  loss   ??? Ezetimibe Other (See Comments)     Memory loss   ??? Pitavastatin Other (See Comments)     Memory loss   ??? Statins-Hmg-Coa Reductase Inhibitors Other (See Comments)     Fatigue and myalgias as well as memory loss        Family History   Problem Relation Age of Onset   ??? Arthritis Mother    ??? Miscarriages / [place] Mother    ??? Cancer Maternal Grandfather    ??? Diabetes Maternal Uncle    ??? Liver disease Maternal Uncle    ??? Heart disease Father    ??? Heart disease Brother    ??? Heart disease Maternal Grandmother    ??? Anesthesia problems Neg Hx    ??? Broken bones Neg Hx    ??? Cancer Neg Hx    ??? Clotting disorder Neg Hx    ??? Collagen disease Neg Hx    ??? Diabetes Neg Hx    ??? Dislocations Neg Hx    ??? Fibromyalgia Neg Hx    ??? Gout Neg Hx    ??? Hemophilia Neg Hx    ??? Osteoporosis Neg Hx    ??? Rheumatologic disease Neg Hx    ??? Scoliosis Neg Hx    ??? Severe sprains Neg Hx    ??? Sickle cell anemia Neg Hx    ??? Spinal Compression Fracture Neg Hx        Social History     Substance and Sexual Activity   Alcohol Use Yes   ??? Alcohol/week: 5.0 standard drinks   ??? Types: 2 Glasses of wine, 1 Cans of beer, 2 Standard drinks or equivalent per week       Social History     Tobacco Use   Smoking Status Former   ??? Packs/day: 1.00   ??? Years: 2.00   ??? Pack years: 2.00   ??? Types: Cigarettes   ??? Quit date: 05/16/1985   ??? Years since quitting: 36.0   Smokeless Tobacco Never       Social History     Substance and Sexual Activity   Drug Use No          Review of Systems:  Review of Systems   Constitutional: Negative for fever, appetite change and unexpected weight change.   HENT: Negative for mouth sores, sore throat, trouble swallowing and voice change.    Eyes: Negative for pain and redness.   Respiratory: Negative for cough, choking and shortness of breath.    Cardiovascular: Negative for chest pain, palpitations and leg swelling.   Gastrointestinal:   See HPI.     Endocrine: Negative for cold intolerance and heat intolerance.   Genitourinary: Negative for dysuria, frequency and difficulty urinating.   Musculoskeletal: Negative for myalgias, back pain, joint swelling, arthralgias and neck pain.   Skin: Negative for rash.   Neurological: Negative for tremors, syncope, weakness and numbness.   Hematological: Does not bruise/bleed easily.   Psychiatric/Behavioral: Negative for hallucinations and confusion.     Objective:   PHYSICAL EXAMINATION  Vitals:    05/13/21 1047   BP: 140/80   Pulse: 82   Temp: 36.7 ??C (98.1 ??F)   SpO2: 96%   Weight: (!) 105.2 kg (232 lb)  Body mass index is 34.26 kg/m??.     Physical Exam   Constitutional:  obese male in no distress.   HEENT:   Head: Normocephalic.   Mouth/Throat:  Oropharynx is clear and moist.  Mucous membranes are normal. No oropharyngeal exudate.   Eyes: Conjunctivae and lids are normal.  No scleral icterus.   Neck: Normal range of motion.   Cardiovascular: Normal rate, regular rhythm and normal heart sounds.    Pulmonary/Chest:   Clear to auscultation bilaterally.  No wheezes or rales.   Abdominal:   Normal bowel sounds.  Protuberant abdomen with surgical scars (ventral midline and LLQ).  Soft, non-tender,  non-distended. No rebound, no guarding and no palpable mass.   Ext:  No edema.  Neurological:  Alert and oriented to person, place, and time.    Skin: Warm and intact.  No jaundice.  Psychiatric: Normal mood and affect.

## 2021-05-19 NOTE — Unmapped (Signed)
[place] [facility] Procedures Referral Form:   Return to [facility]     Preoperative Clearance Form                                 Fax #  [phone]    Anticoagulation and Cardiac Risk Assessment      To:  Dr. Maryagnes Amos  we have received an order for patient:   Chad Hines      February 10, 1956  (DOB, MRN) to have a   colonoscopy since he is on plavix for CAD with prior CABG and PCI to LCX (GI Procedure).      Our records indicate that this patient is on the following anticoagulant/antiplatelet medication, Plavix which need to be held preoperatively for the indicated times in accordance with ASGE Guidelines. If you are not the managing provider for the indicated medication, please provide the contact information of the appropriate provider  .      The patient will not be scheduled for the GI procedure until this form is signed and returned to the above. Please call our nurse [phone] if you have any questions about this form.   Time             Patient's   Anticoagulant/Antiplatelet Medication  Hold Time   (based on ASGE Guidelines)    For Apixaban, Rivaroxaban, and   Dabigatran, please calculate patient's creatinine clearance.  Choose number of hold days based on patient's CrCl.    Warfarin* (Coumadin)    5 days   Apixaban (Eliquis), CrCl (mL/min) > 60    2 days    Clopidogrel (Plavix)    5 days   Apixaban (Eliquis), CrCl (mL/min) 30-59    3 days    Ticagrelor (Brilinta)    3 days   Apixaban (Eliquis), CrCl (mL/min) 15-29    4 days    Prasugrel (Effient)    5 days   Rivaroxaban (Xarelto), CrCl (mL/min) > 90    1 day    Ticlopidine (Ticlid)    10 days   Rivaroxaban (Xarelto), CrCl (mL/min) 60-90    2 days    Enoxaparin* (Lovenox)    24 hours   Rivaroxaban (Xarelto), CrCl (mL/min) 30-59    3 days    Edoxaban (Savaysa)    24 hours   Rivaroxaban (Xarelto), CrCl (mL/min)) 15-29    4 days    Fondaparinux (Arixtra)    48 hours   Dabigatran (Pradaxa), CrCl (mL/min) > 50    2 days or 3 days   (choose)    Dipyridamole (Persantine)    2 days   Dabigatran (Pradaxa), CrCl (mL/min) 30-49    3 days or 4 days   (choose)    Aggrenox (Extended-release Dipyridamole/Aspirin)    7 days   Dabigatran (Pradaxa), CrCl (mL/min) < 29    4 to 6 days   (choose)    Pletal (Cilostazol)    2 days   Other:        * If you determine that your patient needs bridging with enoxaparin while off of their Coumadin or other anticoagulant, please provide orders and instructions to the patient. Please instruct the patient to stop the enoxaparin 24 hours prior to procedure.        If a patient undergoes a complex polypectomy, blood thinning medications will be held at least 5 days post procedure.   If anticoagulant/antiplatelet medications must be continued, or you  disagree with the above recommendations, please indicate rationale here:    (A GI physician will review relative risks/benefits of performing the requested procedure. The procedure request may be deferred or declined based on this review.)           Please complete the Cardiac Risk Assessment if appropriate:        Cardiac Risk Assessment: The patient's risk is considered to be  LOW    INTERMEDIATE     HIGH         No further cardiac testing is needed       Patient is medically optimized for procedure       This patient needs to be seen in the office for cardiac evaluation       Patient is not cleared due to            Provider Signature    Provider Printed Name           Date

## 2021-05-22 NOTE — Unmapped (Signed)
Patient is on Plavix for CAD with prior CABG and PCI to LCX and also have obstructive sleep apnea     Per Dr. Maryagnes Amos - LOW RISK   Ok to hold Plavix x 7d and stay on ASA.      Called patient and got VM. Left message to return call to schedule colonoscopy/LE

## 2021-05-24 MED ORDER — HYOSCYAMINE SULFATE 0.125 MG TABLET
ORAL_TABLET | Freq: Four times a day (QID) | ORAL | 0 refills | 0 days | PRN
Start: 2021-05-24 — End: ?

## 2021-05-26 MED ORDER — HYOSCYAMINE SULFATE 0.125 MG TABLET
ORAL_TABLET | Freq: Four times a day (QID) | ORAL | 0 refills | 15 days | PRN
Start: 2021-05-26 — End: ?

## 2021-05-26 NOTE — Unmapped (Unsigned)
Name:  Chad Hines  DOB: 10-13-1955  Today's Date: 05/26/2021  Age:  66 y.o.    A/P:  {Assess/Plan SmartLinks:21044}    Medication adherence and barriers to the treatment plan have been addressed. Opportunities to optimize healthy behaviors have been discussed. Patient / caregiver voiced understanding.      No follow-ups on file.    S:  Chad Hines is a 66 y.o. male who presents for follow up, last seen by this PCP 04/14/21. Diarrhea - restarted metformin. Pain and swelling of ankle - elevated D-dimer, ordered PVL, found no evidence of DVT or obstruction. Seen by [facility] Dr. Mohammed Kindle 05/13/21 diarrhea - ordered Hydrogen breath test, and CT abdomen.         PHQ-2 Score:    PHQ-9 Score:    {select_status_or_delete_smartlist:64641}    ROS:  A 12 point review of systems was negative except for pertinent items noted in the HPI.    Past medical history, family history, surgical history and social history personally reviewed and verified with patient.     PROBLEM LIST  Patient Active Problem List   Diagnosis    History of MI (myocardial infarction)    Coronary artery disease involving native coronary artery of native heart with angina pectoris (CMS-HCC)    Essential hypertension    Hyperlipidemia    Myositis    Old myocardial infarction    S/P coronary artery bypass graft x 4    Severe obstructive sleep apnea    Annual physical exam    Diabetes mellitus (CMS-HCC)    Fatigue    Hypogonadism male    Ocular ischemic syndrome    Body mass index (BMI)40.0-44.9, adult    Screening for colon cancer    Stress    Class 2 obesity in adult    CVA (cerebral vascular accident) (CMS-HCC)    RLS (restless legs syndrome)    Right foot pain    Pain and swelling of right ankle    Health care maintenance    Memory difficulties    DOE (dyspnea on exertion)    Diarrhea    Pain and swelling of ankle    Lower abdominal pain       O:  There were no vitals filed for this visit.  General appearance: No acute distress, well appearing and well nourished.   HEENT: Normocephalic, atraumatic. Conjunctiva normal, lids w/o swelling, erythema or discharge. TMs gray with intact cone of light. Nasal mucosa, septum, and turbinates w/o edema or erythema. OP normal with no erythema, edema, exudate or lesions.   NECK: Supple, symmetric, trachea midline, no masses. No lymphadenopathy. No thyromegaly or masses.  HEART: RRR w/ normal S1 and S2. No MRG. DP and PT pulses  2+ bilaterally. No peripheral edema or varicosities.  LUNGS: No signs of respiratory distress. CTA all fields. No wheezes, rales, rhonchi or crackles.   ABDOMEN: Non-tender, no masses. Normoactive BS. No hernia appreciated.   MSK: Normal ROM. Strength normal, equal bilaterally.   SKIN: Warm, dry, intact. No suspicious lesions noted on face, trunk, or extremeties.    NEURO: Cranial nerves II-XII intact. Reflexes 2+ and symmetric. No sensory loss.   PSYCH: A&O x 3. Affect normal. Judgment, insight normal. Makes good eye contact. Smiles appropriately. Well-kempt.    I attest that I, Katheran Awe Cendy Oconnor, personally documented this note while acting as scribe for [employer], PAC.      Nicol Herbig E Nayra Coury, Scribe.  06/09/2021     The  documentation recorded by the scribe accurately reflects the service I personally performed and the decisions made by me.    Hillary A Tester, PAC

## 2021-05-26 NOTE — Unmapped (Signed)
Too soon for refill request.  Last Rx 05/13/21.

## 2021-05-30 NOTE — Unmapped (Signed)
05/30/21 Pt said he is no longer taking this medication. sed

## 2021-06-02 NOTE — Unmapped (Signed)
Specialty Medication(s): Praluent 75mg /mL    Chad Hines has been dis-enrolled from the [facility] specialty pharmacy services due to medication discontinuation resulting from side effect intolerance.    Additional information provided to the patient: n/a    [name]  [facility] Specialty Pharmacist

## 2021-06-09 ENCOUNTER — Ambulatory Visit: Admit: 2021-06-09 | Discharge: 2021-06-10 | Payer: PRIVATE HEALTH INSURANCE | Attending: Medical | Primary: Medical

## 2021-06-09 DIAGNOSIS — Z951 Presence of aortocoronary bypass graft: Principal | ICD-10-CM

## 2021-06-09 DIAGNOSIS — Z6835 Body mass index (BMI) 35.0-35.9, adult: Principal | ICD-10-CM

## 2021-06-09 DIAGNOSIS — I25119 Atherosclerotic heart disease of native coronary artery with unspecified angina pectoris: Principal | ICD-10-CM

## 2021-06-09 DIAGNOSIS — I10 Essential (primary) hypertension: Principal | ICD-10-CM

## 2021-06-09 DIAGNOSIS — I2510 Atherosclerotic heart disease of native coronary artery without angina pectoris: Principal | ICD-10-CM

## 2021-06-09 DIAGNOSIS — E291 Testicular hypofunction: Principal | ICD-10-CM

## 2021-06-09 DIAGNOSIS — E782 Mixed hyperlipidemia: Principal | ICD-10-CM

## 2021-06-09 DIAGNOSIS — E1169 Type 2 diabetes mellitus with other specified complication: Principal | ICD-10-CM

## 2021-06-09 DIAGNOSIS — I252 Old myocardial infarction: Principal | ICD-10-CM

## 2021-06-09 MED ORDER — EPINEPHRINE 0.3 MG/0.3 ML INJECTION, AUTO-INJECTOR
Freq: Once | INTRAMUSCULAR | 1 refills | 2 days | Status: CP
Start: 2021-06-09 — End: 2021-06-09

## 2021-06-09 MED ORDER — CLOPIDOGREL 75 MG TABLET
ORAL_TABLET | Freq: Every day | ORAL | 3 refills | 90 days | Status: CP
Start: 2021-06-09 — End: ?

## 2021-06-09 MED ORDER — FUROSEMIDE 40 MG TABLET
ORAL_TABLET | Freq: Every day | ORAL | 3 refills | 90 days | Status: CP
Start: 2021-06-09 — End: ?

## 2021-06-09 MED ORDER — OZEMPIC 1 MG/DOSE (4 MG/3 ML) SUBCUTANEOUS PEN INJECTOR
SUBCUTANEOUS | 3 refills | 84 days | Status: CP
Start: 2021-06-09 — End: 2022-06-09

## 2021-06-09 MED ORDER — LISINOPRIL 10 MG TABLET
ORAL_TABLET | Freq: Every day | ORAL | 3 refills | 90 days | Status: CP
Start: 2021-06-09 — End: 2022-06-09

## 2021-06-09 MED ORDER — METFORMIN ER 500 MG TABLET,EXTENDED RELEASE 24 HR
ORAL_TABLET | Freq: Two times a day (BID) | ORAL | 3 refills | 90 days | Status: CP
Start: 2021-06-09 — End: 2021-09-07

## 2021-09-04 ENCOUNTER — Ambulatory Visit
Admit: 2021-09-04 | Discharge: 2021-09-05 | Payer: PRIVATE HEALTH INSURANCE | Attending: Physician Assistant | Primary: Physician Assistant

## 2021-09-04 DIAGNOSIS — E782 Mixed hyperlipidemia: Principal | ICD-10-CM

## 2021-09-04 DIAGNOSIS — I10 Essential (primary) hypertension: Principal | ICD-10-CM

## 2021-09-04 DIAGNOSIS — I2510 Atherosclerotic heart disease of native coronary artery without angina pectoris: Principal | ICD-10-CM

## 2021-09-05 DIAGNOSIS — E782 Mixed hyperlipidemia: Principal | ICD-10-CM

## 2021-09-05 DIAGNOSIS — I25119 Atherosclerotic heart disease of native coronary artery with unspecified angina pectoris: Principal | ICD-10-CM

## 2021-09-05 MED ORDER — EVOLOCUMAB 140 MG/ML SUBCUTANEOUS PEN INJECTOR
SUBCUTANEOUS | 3 refills | 0 days | Status: CP
Start: 2021-09-05 — End: ?
  Filled 2021-09-16: qty 2, 28d supply, fill #0

## 2021-09-08 DIAGNOSIS — I25119 Atherosclerotic heart disease of native coronary artery with unspecified angina pectoris: Principal | ICD-10-CM

## 2021-09-08 DIAGNOSIS — E782 Mixed hyperlipidemia: Principal | ICD-10-CM

## 2021-09-12 NOTE — Unmapped (Signed)
[facility]   Patient Onboarding/Medication Counseling    Chad Hines is a 66 y.o. male with hyperlipidemia who I am counseling today on initiation of therapy.  I am speaking to the patient.    Was a Nurse, learning disability used for this call? No    Verified patient's date of birth / HIPAA.    Specialty medication(s) to be sent: General Specialty: Repatha      Non-specialty medications/supplies to be sent: none at this time      Medications not needed at this time: n/a         Repatha (evolocumab)    The patient declined counseling on medication administration, missed dose instructions, goals of therapy, side effects and monitoring parameters, warnings and precautions, drug/food interactions, and storage, handling precautions, and disposal because they have taken a similar medication previously. The information in the declined sections below are for informational purposes only and was not discussed with patient.       Medication & Administration     Dosage: Inject the contents of 1 pen (140mg ) under the skin every 2 weeks.    Administration: Administer under the skin of the abdomen, thigh or upper arm. Rotate sites with each injection.  Injection instructions - Syringe:  Remove 1 Repatha syringe and let stand at room temperature for at least 30 minutes.  Check the syringe for the following:  Expiration date  Absence of any cracks or damage  The medicine is clear and colorless and does not contain any particles  Choose your injection site and clean with an alcohol wipe. Allow to air dry completely.  Pull the gray needle cap straight off and dispose of it  Pinch the skin (or stretch) with your thumb and fingers creating an area 2 inches wide  Maintaining the pinch (or stretch) insert the needle at a 45 to 90 degree angle  When you are ready to inject, slowly and steadily press the plunger rod down all the way until the syringe is empty  When done, release your thumb and gently lift the syringe straight off the skin  Dispose of the syringe in a sharps container. Do not try to recap the syringe with the gray needle cap.  If there is blood at the injection site, press a cotton ball or gauze to the site. Do not rub the injection site.  Injection instructions - Autoinjector:  Remove 1 Repatha autoinjector from the refrigerator and let stand at room temperature for at least 30 minutes.  Check the autoinjector for the following:  Expiration date  Absence of any cracks or damage  The medicine is clear and colorless and does not contain any particles  The orange cap is present and securely attached  Choose your injection site and clean with an alcohol wipe. Allow to air dry completely.  Pull the orange cap straight off and discard  Pinch the skin (or stretch) with your thumb and fingers creating an area 2 inches wide  Maintaining the pinch (or stretch) press the pen to your skin at a 90 degree angle. Firmly push the autoinjector down until the skin stops moving and the yellow safety guard is no longer visible.  Do not touch the gray start button yet  When you are ready to inject, press the gray start button. You will hear a click that signals the start of the injection  Continue to press the pen to your skin and lift your thumb  The injection may take up to 15  seconds. You will know the injection is complete when the medication window turns yellow. You may also hear a second click.  Remove the pen from your skin and discard the pen in a sharps container.  If there is blood at the injection site, press a cotton ball or gauze to the site. Do not rub the injection site.    Adherence/Missed dose instructions: Administer a missed dose within 7 days and resume your normal schedule.  If it has been more than 7 days and you inject every 2 weeks, skip the missed dose and resume your normal schedule..     Goals of Therapy     Lower cholesterol, prevention of cardiovascular events in patients with established cardiovascular disease    Side Effects & Monitoring Parameters   Flu-like symptoms  Signs of a common cold  Back pain  Injection site irritation  Nose or throat irritation    The following side effects should be reported to the provider:  Signs of an allergic reaction  Signs of high blood sugar (confusion, drowsiness, increase thirst/hunger/urination, fast breathing, flushing)      Contraindications, Warnings, & Precautions     Latex (the packaging of Repatha may contain natural rubber)    Drug/Food Interactions     Medication list reviewed in Epic. The patient was instructed to inform the care team before taking any new medications or supplements. No drug interactions identified.     Storage, Handling Precautions, & Disposal   Repatha should be stored in the refrigerator. If necessary, Repatha may be kept at room temperature for no more than 30 days.  Place used devices in a sharps container for disposal.      Current Medications (including OTC/herbals), Comorbidities and Allergies     Current Outpatient Medications   Medication Sig Dispense Refill    aspirin 325 MG tablet Take 1 tablet (325 mg total) by mouth daily.      blood sugar diagnostic (ONETOUCH ULTRA TEST) Strp CHECK BLOOD SUGAR AS DIRECTED ONCE A DAY AND FOR SYMPTOMS OF HIGH OR LOW BLOOD SUGAR. 100 strip 1    blood-glucose meter kit Use as instructed 1 each 0    carvediloL (COREG) 6.25 MG tablet Take 1 tablet (6.25 mg total) by mouth Two (2) times a day. 180 tablet 3    clopidogreL (PLAVIX) 75 mg tablet Take 1 tablet (75 mg total) by mouth daily. 90 tablet 3    empty container (SHARPS-A-GATOR DISPOSAL SYSTEM) Misc Use as directed for sharps disposal 1 each 2    EPINEPHrine (EPIPEN) 0.3 mg/0.3 mL injection Inject 0.3 mL (0.3 mg total) into the muscle once for 1 dose. 2 each 1    evolocumab 140 mg/mL PnIj Inject the contents of one pen (140 mg) under the skin every fourteen (14) days. 6 mL 3    furosemide (LASIX) 40 MG tablet Take 1 tablet (40 mg total) by mouth daily. 90 tablet 3    hyoscyamine (LEVSIN) 0.125 mg tablet Take 1 tablet (0.125 mg total) by mouth every six (6) hours as needed for cramping. 60 tablet 0    ibuprofen (ADVIL,MOTRIN) 200 MG tablet Take 2 tablets (400 mg total) by mouth every eight (8) hours as needed for fever.      lancets (ONETOUCH DELICA LANCETS) 33 gauge Misc CHECK BLOOD SUGAR AS DIRECTED ONCE A DAY AND FOR SYMPTOMS OF HIGH OR LOW BLOOD SUGAR. 100 each 1    lisinopriL (PRINIVIL,ZESTRIL) 10 MG tablet Take 1 tablet (10 mg total)  by mouth daily. 90 tablet 3    metFORMIN (GLUCOPHAGE-XR) 500 MG 24 hr tablet Take 2 tablets (1,000 mg total) by mouth in the morning and 2 tablets (1,000 mg total) in the evening. Take with meals. 360 tablet 3    multivitamin (TAB-A-VITE/THERAGRAN) per tablet Take 1 tablet by mouth daily.      nitroglycerin (NITROSTAT) 0.4 MG SL tablet Place 1 tablet (0.4 mg total) under the tongue every five (5) minutes as needed for chest pain.      pen needle, diabetic (NOVOFINE 32) 32 gauge x 1/4 (6 mm) Ndle 1 Units by Miscellaneous route daily. 100 each 2    prednisoLONE acetate (PRED FORTE) 1 % ophthalmic suspension       semaglutide (OZEMPIC) 1 mg/dose (4 mg/3 mL) PnIj injection Inject 1 mg under the skin every seven (7) days. 9 mL 3     No current facility-administered medications for this visit.       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)    Essential hypertension    Hyperlipidemia    Myositis    Old myocardial infarction    S/P coronary artery bypass graft x 4 Severe obstructive sleep apnea    Annual physical exam    Diabetes mellitus (CMS-HCC)    Fatigue    Hypogonadism male    Ocular ischemic syndrome    Body mass index (BMI)40.0-44.9, adult    Screening for colon cancer    Stress    Class 2 obesity in adult    CVA (cerebral vascular accident) (CMS-HCC)    RLS (restless legs syndrome)    Right foot pain    Pain and swelling of right ankle    Health care maintenance    Memory difficulties    DOE (dyspnea on exertion)    Diarrhea    Pain and swelling of ankle    Lower abdominal pain       Reviewed and up to date in Epic.    Appropriateness of Therapy     Acute infections noted within Epic:  No active infections  Patient reported infection: None    Is medication and dose appropriate based on diagnosis and infection status? Yes    Prescription has been clinically reviewed: Yes      Baseline Quality of Life Assessment      How many days over the past month did your hyperlipidemia  keep you from your normal activities? For example, brushing your teeth or getting up in the morning. Patient declined to answer    Financial Information     Medication Assistance provided: Prior Authorization and Copay Assistance    Anticipated copay of $5 (28 days) reviewed with patient. Verified delivery address.    Delivery Information     Scheduled delivery date: 09/16/21    Expected start date: 09/16/21    Medication will be delivered via Same Day Courier to  the prescription address in Epic [place].  This shipment will not require a signature.      Explained the services we provide at [facility] and that each month we would call to set up refills.  Stressed importance of returning phone calls so that we could ensure they receive their medications in time each month.  Informed patient that we should be setting up refills 7-10 days prior to when they will run out of medication.  A pharmacist will reach out to perform a clinical assessment periodically.  Informed patient that a welcome packet, containing information about our pharmacy and other support services, a Notice of Privacy Practices, and a drug information handout will be sent.      The patient or caregiver noted above participated in the development of this care plan and knows that they can request review of or adjustments to the care plan at any time.      Patient or caregiver verbalized understanding of the above information as well as how to contact the pharmacy at [phone] option 4 with any questions/concerns.  The pharmacy is open Monday through Friday 8:30am-4:30pm.  A pharmacist is available 24/7 via pager to answer any clinical questions they may have.    Patient Specific Needs     Does the patient have any physical, cognitive, or cultural barriers? No    Does the patient have adequate living arrangements? (i.e. the ability to store and take their medication appropriately) Yes    Did you identify any home environmental safety or security hazards? No    Patient prefers to have medications discussed with  Patient     Is the patient or caregiver able to read and understand education materials at a high school level or above? Yes    Patient's primary language is  English     Is the patient high risk? No    SOCIAL DETERMINANTS OF HEALTH     At the [facility], we have learned that life circumstances - like trouble affording food, housing, utilities, or transportation can affect the health of many of our patients.   That is why we wanted to ask: are you currently experiencing any life circumstances that are negatively impacting your health and/or quality of life? Patient declined to answer    Social Determinants of Health     Financial Resource Strain: Not on file   Internet Connectivity: Not on file   Food Insecurity: Not on file   Tobacco Use: Medium Risk    Smoking Tobacco Use: Former    Smokeless Tobacco Use: Never    Passive Exposure: Not on file   Housing/Utilities: Not on file   Alcohol Use: Not on file Transportation Needs: Not on file   Substance Use: Not on file   Health Literacy: Low Risk     : Never   Physical Activity: Not on file   Interpersonal Safety: Not on file   Stress: Not on file   Intimate Partner Violence: Not on file   Depression: Not at risk    PHQ-2 Score: 0   Social Connections: Not on file       Would you be willing to receive help with any of the needs that you have identified today? Not applicable       [name]  [facility] Specialty Pharmacist

## 2021-09-12 NOTE — Unmapped (Signed)
[facility] Specialty Medication Onboarding    Specialty Medication: REPATHA SURECLICK 140 mg/mL Pnij (evolocumab)  Prior Authorization: Approved   Financial Assistance: Yes - copay card approved as secondary   Final Copay/Day Supply: $5 / 28    Insurance Restrictions: Yes - max 1 month supply     Notes to Pharmacist:     The triage team has completed the benefits investigation and has determined that the patient is able to fill this medication at [facility]. Please contact the patient to complete the onboarding or follow up with the prescribing physician as needed.

## 2021-10-13 NOTE — Unmapped (Signed)
[facility] Clinical Assessment & Refill Coordination Note    Chad Hines, [place]: Jun 26, 1955  Phone: [phone] (home)     All above HIPAA information was verified with patient.     Was a Nurse, learning disability used for this call? No    Specialty Medication(s):   General Specialty: Repatha     Current Outpatient Medications   Medication Sig Dispense Refill    aspirin 325 MG tablet Take 1 tablet (325 mg total) by mouth daily.      blood sugar diagnostic (ONETOUCH ULTRA TEST) Strp CHECK BLOOD SUGAR AS DIRECTED ONCE A DAY AND FOR SYMPTOMS OF HIGH OR LOW BLOOD SUGAR. 100 strip 1    blood-glucose meter kit Use as instructed 1 each 0    carvediloL (COREG) 6.25 MG tablet Take 1 tablet (6.25 mg total) by mouth Two (2) times a day. 180 tablet 3    clopidogreL (PLAVIX) 75 mg tablet Take 1 tablet (75 mg total) by mouth daily. 90 tablet 3    empty container (SHARPS-A-GATOR DISPOSAL SYSTEM) Misc Use as directed for sharps disposal 1 each 2    EPINEPHrine (EPIPEN) 0.3 mg/0.3 mL injection Inject 0.3 mL (0.3 mg total) into the muscle once for 1 dose. 2 each 1    evolocumab 140 mg/mL PnIj Inject the contents of one pen (140 mg) under the skin every fourteen (14) days. 6 mL 3    furosemide (LASIX) 40 MG tablet Take 1 tablet (40 mg total) by mouth daily. 90 tablet 3    hyoscyamine (LEVSIN) 0.125 mg tablet Take 1 tablet (0.125 mg total) by mouth every six (6) hours as needed for cramping. 60 tablet 0    ibuprofen (ADVIL,MOTRIN) 200 MG tablet Take 2 tablets (400 mg total) by mouth every eight (8) hours as needed for fever.      lancets (ONETOUCH DELICA LANCETS) 33 gauge Misc CHECK BLOOD SUGAR AS DIRECTED ONCE A DAY AND FOR SYMPTOMS OF HIGH OR LOW BLOOD SUGAR. 100 each 1    lisinopriL (PRINIVIL,ZESTRIL) 10 MG tablet Take 1 tablet (10 mg total) by mouth daily. 90 tablet 3    metFORMIN (GLUCOPHAGE-XR) 500 MG 24 hr tablet Take 2 tablets (1,000 mg total) by mouth in the morning and 2 tablets (1,000 mg total) in the evening. Take with meals. 360 tablet 3    multivitamin (TAB-A-VITE/THERAGRAN) per tablet Take 1 tablet by mouth daily.      nitroglycerin (NITROSTAT) 0.4 MG SL tablet Place 1 tablet (0.4 mg total) under the tongue every five (5) minutes as needed for chest pain.      pen needle, diabetic (NOVOFINE 32) 32 gauge x 1/4 (6 mm) Ndle 1 Units by Miscellaneous route daily. 100 each 2    prednisoLONE acetate (PRED FORTE) 1 % ophthalmic suspension       semaglutide (OZEMPIC) 1 mg/dose (4 mg/3 mL) PnIj injection Inject 1 mg under the skin every seven (7) days. 9 mL 3     No current facility-administered medications for this visit.        Changes to medications: Chandon reports no changes at this time.    Allergies   Allergen Reactions    Metronidazole Hives    Atorvastatin Other (See Comments)     Memory  loss    Ezetimibe Other (See Comments)     Memory loss    Pitavastatin Other (See Comments)     Memory loss    Statins-Hmg-Coa Reductase Inhibitors Other (See Comments)  Fatigue and myalgias as well as memory loss       Changes to allergies: No    SPECIALTY MEDICATION ADHERENCE     Repatha 140 mg/ml: 4 days of medicine on hand       Medication Adherence    Patient reported X missed doses in the last month: 0  Specialty Medication: Repatha 140mg /mL  Informant: patient                            Specialty medication(s) dose(s) confirmed: Regimen is correct and unchanged.     Are there any concerns with adherence? No    Adherence counseling provided? Not needed    CLINICAL MANAGEMENT AND INTERVENTION      Clinical Benefit Assessment:    Do you feel the medicine is effective or helping your condition? Yes    Clinical Benefit counseling provided? Not needed    Adverse Effects Assessment:    Are you experiencing any side effects? No    Are you experiencing difficulty administering your medicine? No    Quality of Life Assessment:       How many days over the past month did your CAD  keep you from your normal activities? For example, brushing your teeth or getting up in the morning. Patient declined to answer    Have you discussed this with your provider? Not needed    Acute Infection Status:    Acute infections noted within Epic:  No active infections  Patient reported infection: None    Therapy Appropriateness:    Is therapy appropriate and patient progressing towards therapeutic goals? Yes, therapy is appropriate and should be continued    DISEASE/MEDICATION-SPECIFIC INFORMATION      For patients on injectable medications: Patient currently has 0 doses left.  Next injection is scheduled for 10/17/21.    PATIENT SPECIFIC NEEDS     Does the patient have any physical, cognitive, or cultural barriers? No    Is the patient high risk? No    Does the patient require a Care Management Plan? No     SOCIAL DETERMINANTS OF HEALTH     At the [facility], we have learned that life circumstances - like trouble affording food, housing, utilities, or transportation can affect the health of many of our patients.   That is why we wanted to ask: are you currently experiencing any life circumstances that are negatively impacting your health and/or quality of life? Patient declined to answer    Social Determinants of Health     Financial Resource Strain: Low Risk  (07/14/2019)    Overall Financial Resource Strain (CARDIA)     Difficulty of Paying Living Expenses: Not hard at all   Internet Connectivity: Not on file   Food Insecurity: No Food Insecurity (07/14/2019)    Hunger Vital Sign     Worried About Running Out of Food in the Last Year: Never true     Ran Out of Food in the Last Year: Never true   Tobacco Use: Medium Risk (09/04/2021)    Patient History     Smoking Tobacco Use: Former     Smokeless Tobacco Use: Never     Passive Exposure: Not on file   Housing/Utilities: Low Risk  (07/14/2019)    Housing/Utilities     Within the past 12 months, have you ever stayed: outside, in a car, in a tent, in an overnight shelter, or temporarily in someone else's  home (i.e. couch-surfing)?: No     Are you worried about losing your housing?: No     Within the past 12 months, have you been unable to get utilities (heat, electricity) when it was really needed?: No   Alcohol Use: Not At Risk (07/14/2019)    Alcohol Use     How often do you have a drink containing alcohol?: Monthly or less     How many drinks containing alcohol do you have on a typical day when you are drinking?: 3 - 4     How often do you have 5 or more drinks on one occasion?: Never   Transportation Needs: No Transportation Needs (07/14/2019)    PRAPARE - Transportation     Lack of Transportation (Medical): No     Lack of Transportation (Non-Medical): No   Substance Use: Low Risk  (07/14/2019)    Substance Use     Taken prescription drugs for non-medical reasons: Never     Taken illegal drugs: Never     Patient indicated they have taken drugs in the past year for non-medical reasons: Yes, [positive answer(s)]: Not on file   Health Literacy: Low Risk  (12/09/2020)    Health Literacy     : Never   Physical Activity: Inactive (07/14/2019)    Exercise Vital Sign     Days of Exercise per Week: 0 days     Minutes of Exercise per Session: 0 min   Interpersonal Safety: Not on file   Stress: Stress Concern Present (07/14/2019)    [employer] of Occupational Health - Occupational Stress Questionnaire     Feeling of Stress : To some extent   Intimate Partner Violence: Not At Risk (07/14/2019)    Humiliation, Afraid, Rape, and Kick questionnaire     Fear of Current or Ex-Partner: No     Emotionally Abused: No     Physically Abused: No     Sexually Abused: No   Depression: Not at risk (12/09/2020)    PHQ-2     PHQ-2 Score: 0   Social Connections: Moderately Isolated (07/14/2019)    Social Connection and Isolation Panel [NHANES]     Frequency of Communication with Friends and Family: More than three times a week     Frequency of Social Gatherings with Friends and Family: More than three times a week     Attends Religious Services: Never     Database administrator or Organizations: No     Attends Banker Meetings: Never     Marital Status: Married       Would you be willing to receive help with any of the needs that you have identified today? Not applicable       SHIPPING     Specialty Medication(s) to be Shipped:   General Specialty: Repatha    Other medication(s) to be shipped: No additional medications requested for fill at this time     Changes to insurance: No    Delivery Scheduled: Yes, Expected medication delivery date: 10/15/21.     Medication will be delivered via Same Day Courier to the confirmed prescription address in [facility].    The patient will receive a drug information handout for each medication shipped and additional FDA Medication Guides as required.  Verified that patient has previously received a Conservation officer, historic buildings and a Surveyor, mining.    The patient or caregiver noted above participated in the development of this care plan and knows that  they can request review of or adjustments to the care plan at any time.      All of the patient's questions and concerns have been addressed.    [name]   [facility] Specialty Pharmacist

## 2021-10-15 MED FILL — REPATHA SURECLICK 140 MG/ML SUBCUTANEOUS PEN INJECTOR: SUBCUTANEOUS | 28 days supply | Qty: 2 | Fill #1

## 2021-11-06 DIAGNOSIS — Z1211 Encounter for screening for malignant neoplasm of colon: Principal | ICD-10-CM

## 2021-11-07 MED ORDER — PEG 3350-ELECTROLYTES 236 GRAM-22.74 GRAM-6.74 GRAM-5.86 GRAM SOLUTION
Freq: Once | ORAL | 0 refills | 1 days | Status: CP
Start: 2021-11-07 — End: 2021-11-08
  Filled 2021-11-07: qty 4000, 1d supply, fill #0

## 2021-11-07 NOTE — Unmapped (Signed)
Phoned pt to verify if he is currently taking plavix, as he has instructions to hold for 7 days and bridge with aspirin, per Dr. Maryagnes Amos (3/27 MyChart encounter). His procedure is on 9/14. Last dose should have been on 9/6.    Pt states he lost his medication for one week and found it today. He took one dose this morning, the only dose he's taken for the last week.    Advised pt to hold plavix going forward, but continue aspirin as instructed by Dr. Maryagnes Amos.

## 2021-11-07 NOTE — Unmapped (Signed)
Colonoscopy  Procedure #1   0  Procedure #2   [id number]  MRN   0  Endoscopist   FALSE  Is the patient's health insurance [address], [facility]), or [employer]age?   FALSE  Urgent procedure   FALSE  Do you take: Plavix (clopidogrel), Coumadin (warfarin), Lovenox (enoxaparin), Pradaxa (dabigatran), Effient (prasugrel), Xarelto (rivaroxaban), Eliquis (apixaban), Pletal (cilostazol), or Brilinta (ticagrelor)?   FALSE  Do you have hemophilia, von Willebrand disease, thrombocytopenia?   FALSE  Do you have a pacemaker or implanted cardiac defibrillator?   FALSE  Are you pregnant?   FALSE  Has a [place] [facility] provider specified the location(s)?     Which location(s) did the [facility] provider specify?   FALSE     [facility]   FALSE     [place]   FALSE     [facility]of[facility]   FALSE     [facility]MO[facility]-Mod Sedation   FALSE  Is procedure indication for variceal banding (this does NOT include variceal screening)?        5  Height (feet)   9  Height (inches)   243  Weight (pounds)   35.9  BMI        FALSE  Did the ordering provider specify a bowel prep?   0       What bowel prep was specified?   FALSE  Do you have chronic kidney disease?   FALSE  Do you have chronic constipation or have you had poor quality bowel preps for past colonoscopies?   FALSE  Do you have Crohn's disease or ulcerative colitis?   FALSE  Have you had weight loss surgery?        FALSE  Are you in the process of scheduling or awaiting results of a heart ultrasound, stress test, or catheterization to evaluate new or worsening chest pain, dizziness, or shortness of breath?   FALSE  When you walk around your house or grocery store, do you have to stop and rest due to shortness of breath, chest pain, or light-headedness?   FALSE  Have you had a heart attack, stroke or heart stent placement within the past 6 months?   FALSE  Do you ever use supplemental oxygen?   FALSE  Have you been hospitalized for cirrhosis of the liver or heart failure in the last 12 months?   FALSE  Have you been treated for mouth or throat cancer with radiation or surgery?   FALSE  Have you been told that it is difficult for doctors to insert a breathing tube in you during anesthesia?   FALSE  Have you had a heart or lung transplant?        FALSE  Are you on dialysis?   FALSE  Do you have cirrhosis of the liver?   FALSE  Do you have myasthenia gravis?   FALSE  Is the patient a prisoner?        FALSE  Have you been diagnosed with sleep apnea or do you wear a CPAP machine at night?   FALSE  Are you younger than 30?   FALSE  Have you previously received propofol sedation administered by an anesthesiologist for a GI procedure?   FALSE  Do you drink an average of more than 3 drinks of alcohol per day?   FALSE  Do you regularly take suboxone or any prescription medications for chronic pain?   FALSE  Do you regularly take Ativan, Klonopin, Xanax, Valium, lorazepam, clonazepam, alprazolam,  or diazepam?   FALSE  Have you previously had difficulty with sedation during a GI procedure?   FALSE  Have you been diagnosed with PTSD?   FALSE  Are you allergic to fentanyl or midazolam (Versed)?   FALSE  Do you take medications for HIV?   ################### ###################################################################################################################   MRN:          [id number]   Anticoag Review:  No   Nurse Triage:  No   GI Clinic Consult:  No   Procedure(s):  Colonoscopy     0   [facility]s):  [facility]     [facility]      [place]       [facility]-Mod Sed   Endoscopist:  0   Urgent:            No   Prep:               [place]        ################### ###################################################################################################################

## 2021-11-10 NOTE — Unmapped (Signed)
Attempted pre-procedure phone call on 11/10/21, however, VM was full.  Unable to leave a message at this time.

## 2021-11-13 ENCOUNTER — Encounter
Admit: 2021-11-13 | Discharge: 2021-11-13 | Payer: PRIVATE HEALTH INSURANCE | Attending: Student in an Organized Health Care Education/Training Program | Primary: Student in an Organized Health Care Education/Training Program

## 2021-11-13 ENCOUNTER — Ambulatory Visit: Admit: 2021-11-13 | Discharge: 2021-11-13 | Payer: PRIVATE HEALTH INSURANCE

## 2021-11-13 MED ADMIN — lactated Ringers infusion: 10 mL/h | INTRAVENOUS | @ 13:00:00 | Stop: 2021-11-13

## 2021-11-13 MED ADMIN — propofoL (DIPRIVAN) injection: INTRAVENOUS | @ 13:00:00 | Stop: 2021-11-13

## 2021-11-13 MED ADMIN — phenylephrine 20 mg in sodium chloride 0.9% 250 mL (80 mcg/mL) infusion PMB: INTRAVENOUS | @ 13:00:00 | Stop: 2021-11-13

## 2021-11-13 MED ADMIN — propofol (DIPRIVAN) infusion 10 mg/mL: INTRAVENOUS | @ 13:00:00 | Stop: 2021-11-13

## 2021-11-13 MED ADMIN — lidocaine (XYLOCAINE) 20 mg/mL (2 %) injection: INTRAVENOUS | @ 13:00:00 | Stop: 2021-11-13

## 2021-11-13 MED ADMIN — lactated Ringers infusion: INTRAVENOUS | @ 13:00:00 | Stop: 2021-11-13

## 2021-11-20 NOTE — Unmapped (Unsigned)
11/20/21 Pt said he has paused this medication. He would like a call back on 12/14/21 But that is a Sunday. To follow up. sed

## 2021-12-07 DIAGNOSIS — I10 Essential (primary) hypertension: Principal | ICD-10-CM

## 2021-12-07 DIAGNOSIS — E1169 Type 2 diabetes mellitus with other specified complication: Principal | ICD-10-CM

## 2021-12-07 DIAGNOSIS — I25119 Atherosclerotic heart disease of native coronary artery with unspecified angina pectoris: Principal | ICD-10-CM

## 2021-12-07 MED ORDER — CLOPIDOGREL 75 MG TABLET
ORAL_TABLET | Freq: Every day | ORAL | 3 refills | 90 days
Start: 2021-12-07 — End: ?

## 2021-12-07 MED ORDER — CARVEDILOL 6.25 MG TABLET
ORAL_TABLET | Freq: Two times a day (BID) | ORAL | 3 refills | 90 days
Start: 2021-12-07 — End: ?

## 2021-12-07 MED ORDER — FUROSEMIDE 40 MG TABLET
ORAL_TABLET | Freq: Every day | ORAL | 3 refills | 90 days
Start: 2021-12-07 — End: ?

## 2021-12-07 MED ORDER — LISINOPRIL 10 MG TABLET
ORAL_TABLET | Freq: Every day | ORAL | 3 refills | 90 days
Start: 2021-12-07 — End: 2022-12-07

## 2021-12-07 MED ORDER — METFORMIN ER 500 MG TABLET,EXTENDED RELEASE 24 HR
ORAL_TABLET | Freq: Two times a day (BID) | ORAL | 3 refills | 90 days
Start: 2021-12-07 — End: 2022-03-07

## 2021-12-07 MED ORDER — NITROGLYCERIN 0.4 MG SUBLINGUAL TABLET
ORAL_TABLET | SUBLINGUAL | 0 refills | 1 days | PRN
Start: 2021-12-07 — End: ?

## 2021-12-07 MED ORDER — OZEMPIC 1 MG/DOSE (4 MG/3 ML) SUBCUTANEOUS PEN INJECTOR
SUBCUTANEOUS | 3 refills | 84 days
Start: 2021-12-07 — End: 2022-12-07

## 2021-12-08 MED ORDER — NITROGLYCERIN 0.4 MG SUBLINGUAL TABLET
ORAL_TABLET | SUBLINGUAL | 0 refills | 1 days | Status: CP | PRN
Start: 2021-12-08 — End: ?
  Filled 2021-12-11: qty 25, 8d supply, fill #0

## 2021-12-08 MED ORDER — OZEMPIC 1 MG/DOSE (4 MG/3 ML) SUBCUTANEOUS PEN INJECTOR
SUBCUTANEOUS | 3 refills | 84 days
Start: 2021-12-08 — End: 2022-12-08

## 2021-12-08 MED ORDER — CLOPIDOGREL 75 MG TABLET
ORAL_TABLET | Freq: Every day | ORAL | 3 refills | 90 days
Start: 2021-12-08 — End: ?

## 2021-12-08 MED ORDER — METFORMIN ER 500 MG TABLET,EXTENDED RELEASE 24 HR
ORAL_TABLET | Freq: Two times a day (BID) | ORAL | 1 refills | 90 days | Status: CP
Start: 2021-12-08 — End: 2022-06-09

## 2021-12-08 MED ORDER — FUROSEMIDE 40 MG TABLET
ORAL_TABLET | Freq: Every day | ORAL | 3 refills | 90 days
Start: 2021-12-08 — End: ?

## 2021-12-08 MED ORDER — CARVEDILOL 6.25 MG TABLET
ORAL_TABLET | Freq: Two times a day (BID) | ORAL | 3 refills | 90 days
Start: 2021-12-08 — End: ?

## 2021-12-08 MED ORDER — LISINOPRIL 10 MG TABLET
ORAL_TABLET | Freq: Every day | ORAL | 3 refills | 90 days
Start: 2021-12-08 — End: 2022-12-08

## 2021-12-08 NOTE — Unmapped (Signed)
Patient is requesting the following refill  Requested Prescriptions     Pending Prescriptions Disp Refills    nitroglycerin (NITROSTAT) 0.4 MG SL tablet 25 tablet 0     Sig: Place 1 tablet (0.4 mg total) under the tongue every five (5) minutes as needed for chest pain.     Signed Prescriptions Disp Refills    metFORMIN (GLUCOPHAGE-XR) 500 MG 24 hr tablet 360 tablet 1     Sig: Take 2 tablets (1,000 mg total) by mouth in the morning and 2 tablets (1,000 mg total) in the evening. Take with meals.     Authorizing Provider: Sonny Dandy ASPEN     Ordering User: Mickle Asper     Refused Prescriptions Disp Refills    carvediloL (COREG) 6.25 MG tablet 180 tablet 3     Sig: Take 1 tablet (6.25 mg total) by mouth Two (2) times a day.     Refused By: Cheri Rous F     Reason for Refusal: Patient has requested refill too soon    semaglutide (OZEMPIC) 1 mg/dose (4 mg/3 mL) PnIj injection 9 mL 3     Sig: Inject 1 mg under the skin every seven (7) days.     Refused By: Cheri Rous F     Reason for Refusal: Patient has requested refill too soon    clopidogreL (PLAVIX) 75 mg tablet 90 tablet 3     Sig: Take 1 tablet (75 mg total) by mouth daily.     Refused By: Cheri Rous F     Reason for Refusal: Patient has requested refill too soon    furosemide (LASIX) 40 MG tablet 90 tablet 3     Sig: Take 1 tablet (40 mg total) by mouth daily.     Refused By: Cheri Rous F     Reason for Refusal: Patient has requested refill too soon    lisinopriL (PRINIVIL,ZESTRIL) 10 MG tablet 90 tablet 3     Sig: Take 1 tablet (10 mg total) by mouth daily.     Refused By: Cheri Rous F     Reason for Refusal: Patient has requested refill too soon       Recent Visits  Date Type Provider Dept   06/09/21 Office Visit Tester, [place], [facility] [address]   04/14/21 Office Visit Tester, [place], [facility] [address] [facility]   04/08/21 Office Visit Tester, [place], [facility] [address]   12/09/20 Office Visit Tester, [place], [facility] [address]   Showing recent visits within past 365 days with a meds authorizing provider and meeting all other requirements  Future Appointments  Date Type Provider Dept   12/11/21 Appointment Tester, [place], [facility] [address] [place] [address]   Showing future appointments within next 365 days with a meds authorizing provider and meeting all other requirements       Labs: Not applicable this refill

## 2021-12-10 NOTE — Unmapped (Signed)
[facility] Refill Coordination Note    Specialty Medication(s) to be Shipped:   General Specialty: Repatha    Other medication(s) to be shipped:  Metformin, Nitroglycerin     Chad Hines, DOB: Jul 17, 1955  Phone: [phone] (home)       All above HIPAA information was verified with patient.     Was a Nurse, learning disability used for this call? No    Completed refill call assessment today to schedule patient's medication shipment from the [facility] [phone]).  All relevant notes have been reviewed.     Specialty medication(s) and dose(s) confirmed: Regimen is correct and unchanged.   Changes to medications: [name] reports no changes at this time.  Changes to insurance: No  New side effects reported not previously addressed with a pharmacist or physician: None reported  Questions for the pharmacist: No    Confirmed patient received a Conservation officer, historic buildings and a Surveyor, mining with first shipment. The patient will receive a drug information handout for each medication shipped and additional FDA Medication Guides as required.       DISEASE/MEDICATION-SPECIFIC INFORMATION        For patients on injectable medications: Patient currently has 0 doses left.  Next injection is scheduled for 12/12/21.    SPECIALTY MEDICATION ADHERENCE     Medication Adherence    Patient reported X missed doses in the last month: all  Specialty Medication: Repatha 140mg /mL  Informant: patient                                Were doses missed due to medication being on hold?  Yes    Repatha 140 mg/ml: 0 days of medicine on hand       REFERRAL TO PHARMACIST     Referral to the pharmacist: Not needed      [facility]     Shipping address confirmed in Epic.     Delivery Scheduled: Yes, Expected medication delivery date: 12/11/21.     Medication will be delivered via Same Day Courier to the prescription address in Epic W[facility].    [name], PharmD   [facility] Specialty Pharmacist

## 2021-12-11 ENCOUNTER — Ambulatory Visit: Admit: 2021-12-11 | Discharge: 2021-12-12 | Payer: PRIVATE HEALTH INSURANCE | Attending: Medical | Primary: Medical

## 2021-12-11 DIAGNOSIS — E1169 Type 2 diabetes mellitus with other specified complication: Principal | ICD-10-CM

## 2021-12-11 DIAGNOSIS — I639 Cerebral infarction, unspecified: Principal | ICD-10-CM

## 2021-12-11 DIAGNOSIS — G4733 Obstructive sleep apnea (adult) (pediatric): Principal | ICD-10-CM

## 2021-12-11 DIAGNOSIS — Z0000 Encounter for general adult medical examination without abnormal findings: Principal | ICD-10-CM

## 2021-12-11 DIAGNOSIS — I10 Essential (primary) hypertension: Principal | ICD-10-CM

## 2021-12-11 DIAGNOSIS — I25119 Atherosclerotic heart disease of native coronary artery with unspecified angina pectoris: Principal | ICD-10-CM

## 2021-12-11 DIAGNOSIS — E782 Mixed hyperlipidemia: Principal | ICD-10-CM

## 2021-12-11 DIAGNOSIS — E291 Testicular hypofunction: Principal | ICD-10-CM

## 2021-12-11 LAB — COMPREHENSIVE METABOLIC PANEL
ALBUMIN: 4.2 g/dL (ref 3.4–5.0)
ALKALINE PHOSPHATASE: 60 U/L (ref 46–116)
ALT (SGPT): 22 U/L (ref 10–49)
ANION GAP: 8 mmol/L (ref 5–14)
AST (SGOT): 21 U/L (ref <=34)
BILIRUBIN TOTAL: 1 mg/dL (ref 0.3–1.2)
BLOOD UREA NITROGEN: 16 mg/dL (ref 9–23)
BUN / CREAT RATIO: 14
CALCIUM: 10 mg/dL (ref 8.7–10.4)
CHLORIDE: 102 mmol/L (ref 98–107)
CO2: 28.1 mmol/L (ref 20.0–31.0)
CREATININE: 1.12 mg/dL — ABNORMAL HIGH
EGFR CKD-EPI (2021) MALE: 73 mL/min/{1.73_m2} (ref >=60)
GLUCOSE RANDOM: 107 mg/dL — ABNORMAL HIGH (ref 70–99)
POTASSIUM: 4.2 mmol/L (ref 3.4–4.8)
PROTEIN TOTAL: 7.2 g/dL (ref 5.7–8.2)
SODIUM: 138 mmol/L (ref 135–145)

## 2021-12-11 LAB — CBC
HEMATOCRIT: 42.6 % (ref 39.0–48.0)
HEMOGLOBIN: 14.5 g/dL (ref 12.9–16.5)
MEAN CORPUSCULAR HEMOGLOBIN CONC: 33.9 g/dL (ref 32.0–36.0)
MEAN CORPUSCULAR HEMOGLOBIN: 31.1 pg (ref 25.9–32.4)
MEAN CORPUSCULAR VOLUME: 91.6 fL (ref 77.6–95.7)
MEAN PLATELET VOLUME: 8.2 fL (ref 6.8–10.7)
PLATELET COUNT: 287 10*9/L (ref 150–450)
RED BLOOD CELL COUNT: 4.65 10*12/L (ref 4.26–5.60)
RED CELL DISTRIBUTION WIDTH: 13.2 % (ref 12.2–15.2)
WBC ADJUSTED: 7.4 10*9/L (ref 3.6–11.2)

## 2021-12-11 LAB — LIPID PANEL
CHOLESTEROL/HDL RATIO SCREEN: 4.2 (ref 1.0–4.5)
CHOLESTEROL: 197 mg/dL (ref <=200)
HDL CHOLESTEROL: 47 mg/dL (ref 40–60)
LDL CHOLESTEROL CALCULATED: 116 mg/dL — ABNORMAL HIGH (ref 40–99)
NON-HDL CHOLESTEROL: 150 mg/dL — ABNORMAL HIGH (ref 70–130)
TRIGLYCERIDES: 172 mg/dL — ABNORMAL HIGH (ref 0–150)
VLDL CHOLESTEROL CAL: 34.4 mg/dL (ref 12–42)

## 2021-12-11 MED ORDER — METFORMIN ER 500 MG TABLET,EXTENDED RELEASE 24 HR
ORAL_TABLET | Freq: Every day | ORAL | 3 refills | 90 days | Status: CP
Start: 2021-12-11 — End: ?

## 2021-12-11 MED ORDER — EPINEPHRINE 0.3 MG/0.3 ML INJECTION, AUTO-INJECTOR
Freq: Once | INTRAMUSCULAR | 1 refills | 2 days | Status: CP
Start: 2021-12-11 — End: 2021-12-11

## 2021-12-11 MED ORDER — CARVEDILOL 3.125 MG TABLET
ORAL_TABLET | Freq: Two times a day (BID) | ORAL | 3 refills | 90 days | Status: CP
Start: 2021-12-11 — End: 2022-12-11

## 2021-12-11 MED ORDER — LISINOPRIL 5 MG TABLET
ORAL_TABLET | Freq: Every day | ORAL | 3 refills | 90 days | Status: CP
Start: 2021-12-11 — End: 2022-12-11

## 2021-12-11 MED FILL — REPATHA SURECLICK 140 MG/ML SUBCUTANEOUS PEN INJECTOR: SUBCUTANEOUS | 28 days supply | Qty: 2 | Fill #2

## 2021-12-11 NOTE — Unmapped (Signed)
Stay up to date with all of your appointments, request medication refills and communication with your care team all in one place by using My [place] Chart. If you need assistance signing up for  My [facility] Chart or password recovery please contact  [facility] [facility] at [phone] M-F 8am-7pm      Test Result Update!  You may notice that your labs and results are now available as soon as they are resulted--usually even within 24 hours! This means you may see your results even before I see them. Please wait for me to respond to your results before sending your questions. I usually respond to results within 3 business days, sooner if more urgent. If you have not heard from me and you feel the result is urgent, feel free to send me a message via My [facility] chart. Otherwise, please wait for me to review the lab result and respond appropriately.     Non Urgent Needs:  Please consider sending a non urgent patient advice request via your My [place] chart, these messages are reviewed by clinical staff and will be responded to within 24-48 business hours. If you have an urgent need please schedule an appointment online via your My [facility] chart or call the office directly to schedule.     Medication Refill Requests:  Please submit a refill request via your My [facility] chart or call your pharmacy to have the refill request sent electronically. Please allow 48-72 business hours for all refill requests. If a prior authorization is required, additional time may be needed to allow for medical review and communication to your insurance provider.     Form Request  All FMLA, Disability and School paperwork requires an appointment. Please schedule via your My [facility] chart or call the office to schedule. Please allow up to 7 business days for all other paperwork. We will contact you once completed for pick up.

## 2021-12-11 NOTE — Unmapped (Signed)
BP is at goal. Home Bps not confident in home BP monitor    Reduce lisinopril to 5 mg every day.   Continue carvedilol to 3.125 mg BID   Labs as below  Encouraged healthy, low-salt diet and regular exercise. Recommended pt check BP - goal less than 130/80 or less.     BP Readings from Last 3 Encounters:   12/11/21 98/67   11/13/21 109/81   09/04/21 115/80     Lab Results   Component Value Date    K 4.3 06/09/2021    K 4.3 04/14/2021    NA 146 (H) 06/09/2021    NA 143 04/14/2021    CREATININE 0.91 06/09/2021    CREATININE 0.89 04/14/2021

## 2021-12-11 NOTE — Unmapped (Signed)
Name:  Chad Hines  DOB: [birth year]  Today's Date: 12/11/2021  Age:  66 y.o.    Assessment and Plan:     Problem List Items Addressed This Visit       Coronary artery disease involving native coronary artery of native heart with angina pectoris (CMS-HCC)     Previously followed by [facility], but now established with Dr. Maryagnes [facility], last seen 09/04/21. LHC 01/2021 showed patent LIMA-LAD, chronic occlusions of SVG x 2, patent LCX / OM stents with elevated LVEDP, normal EF.  Denies CP, SOB, but admits to occasional L/D and swelling in ext. Reduced lisinopril today given low BP. Edema improves with furosemide   Medications:  Continue clopidogrel 75 mg every day, ASA 81 mg every day, carvedilol 3.25 mg BID as prescribed. Furosemide 40 mg prn (increased by Cardio for elevated filling pressures and dyspnea). Refills as below.   Per cardio- ASA and Plavix to be continued indefinitely  Goal LDL is < 70, however pt intolerant to statins and previously stopped PCSK9  2/2 SE of diarrhea  and ?ankle pain and swelling, but these symptoms have resolved and he reports has been taking as prescribed for the last 2-3 months with exception of one missed dose  Refills as below  Follow up with with cardio as scheduled      LHC 02/04/2021  Conclusions:  Severe Native 2V CAD with CTO LAD and nondominant RCA; patent LCX / OM stents  Patent LIMA-LAD; chronic occlusions of SVG x 2  NL LVEF 50-55% with inferobasal/posterior HK  Elevated LVEDP         Relevant Medications    carvediloL (COREG) 3.125 MG tablet    aspirin (ECOTRIN) 81 MG tablet    lisinopriL (PRINIVIL,ZESTRIL) 5 MG tablet    Other Relevant Orders    Comprehensive Metabolic Panel (Completed)    Lipid Panel (Completed)    Essential hypertension     BP is at goal. Home Bps not confident in home BP monitor    Reduce lisinopril to 5 mg every day. Monitor home BP  Continue carvedilol to 3.125 mg BID   Labs as below  Encouraged healthy, low-salt diet and regular exercise. Recommended pt check BP - goal less than 130/80 or less.     BP Readings from Last 3 Encounters:   12/11/21 98/67   11/13/21 109/81   09/04/21 115/80     Lab Results   Component Value Date    K 4.3 06/09/2021    K 4.3 04/14/2021    NA 146 (H) 06/09/2021    NA 143 04/14/2021    CREATININE 0.91 06/09/2021    CREATININE 0.89 04/14/2021          Relevant Medications    carvediloL (COREG) 3.125 MG tablet    lisinopriL (PRINIVIL,ZESTRIL) 5 MG tablet    Other Relevant Orders    Comprehensive Metabolic Panel (Completed)    Lipid Panel (Completed)    Hyperlipidemia     Intolerant to statins. Now on evolocumab 140 mg q 14 days. Reports missed one dose in last 2-3 months.  -Labs as below    Lab Results   Component Value Date    LDL 116 (H) 12/11/2021    HDL 47 12/11/2021            Relevant Orders    Comprehensive Metabolic Panel (Completed)    Lipid Panel (Completed)    Severe obstructive sleep apnea     HX  of OSA. Previously followed by [facility]. Failure on CPAP and BiPAP per pt.He is also status post uvuloplasty. Patient does not use CPAP.  - Previously discussed updated sleep study as pt patient lost ~40 pounds.            Annual physical exam - Primary     Age appropriate preventative care, health maintenance, and anticipatory guidance discussed.  Immunizations:  Influenza vaccine - 12/11/21  Pneumococcal Polysaccharide 23 - 02/16/19  Prevnar 20- 12/09/20  TdaP - 11/24/18  Zoster- 12/09/2020, 06/07/2020   COVID- 03/10/2020, 07/03/2019, 06/05/2019; declines further COVID vaccinations  Screenings:  PSA - 06/09/21 WNL.   CRC - 11/13/21, repeat 7 yrs; due 10/2028  Hep C - 11/24/18, negative  AAA- due at 65 yo; plan to order at 2024 CPE  Ordered labs as below.             Diabetes mellitus (CMS-HCC)     Diabetes is at goal < 7.0, today's A1C 5.9.     Medication: metformin 1000 mg every day (changed Rx to reflect dose pt is taking with adequate control), Ozempic 1 mg weekly.   - Decreased Ozempic 2 mg weekly --> 1 mg weekly last visit given excellent glycemic control. Diarrhea has improved, but some wt gain. Can reconsider increasing again in future.    Lab Results   Component Value Date    Hemoglobin A1C 5.9 12/11/2021      ADA diet. Exercise daily 30 minutes. Dental exam annually. Foot exam daily/moisturizerdaily. Advised to follow up with his/her ophthalmologist annually.    DM Check list:  Foot Exam: 06/09/21  Retinal Exam: 12/10/21  Flu shot: 12/11/21  Pneumovax: 02/16/19. Prevnar20- 12/09/20  Baseline EKG: 05/27/17, +RBBB  Microalbumin: on lisinopril  Lab Results   Component Value Date    Albumin/Creatinine Ratio  06/09/2021      Comment:      Unable to calculate.   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 Some fatigue. Labs as below         Relevant Orders    Testosterone, free, total    CBC (Completed)    Ocular ischemic syndrome     Hx of ocular stoke. Aware- aggressive secondary prevention             Medication adherence and barriers to the treatment plan have been addressed. Opportunities to optimize healthy behaviors have been discussed. Patient / caregiver voiced understanding.      Return in about 6 months (around 06/12/2022) for Recheck cdm .    Subjective:     Chad Hines 66 y.o.male   is being seen for a health maintenance exam.    Chief Complaint   Patient presents with    Annual Exam     HTN  Home BP running not confident in home BP- doesn't check very often 120-something over 80 something   No CP, SOB,  occasional L/D and swelling in ankles    BP Readings from Last 3 Encounters:   12/11/21 98/67   11/13/21 109/81   09/04/21 115/80     Dm2  Lab Results   Component Value Date    A1C 5.9 12/11/2021       Metformin only taking 2 tablets daily     Wt Readings from Last 6 Encounters:   12/11/21 (!) 113.4 kg (250 lb)   11/13/21 (!) 108.9 kg (240 lb)   09/04/21 (!) 110.6 kg (243 lb 14.4 oz)   06/09/21 (!) 108 kg (238 lb)   05/13/21 (!) 105.2 kg (232 lb)   04/14/21 (!) 110.2 kg (243 lb)         PHQ-2 Score: 0    PHQ-9 Score:      Edinburgh Score:    Screening complete, no depression identified / no further action needed today  Screening complete, no depression identified / no further action needed today    Lifestyle:   Diet: well balanced diet discussed/reviewed in AVS   Physical Activity: recommend aiming for 150 min of moderate cardiovascular exercise weekly -  nothing  Sleep:Recommend 7-8 hours sleep nightly.    Dental and Eye exams are -Routine eye and dental exams recommended.    Social History:     Social History     Socioeconomic History    Marital status: Married     Spouse name: Misty Stanley    Number of children: 2    Years of education: None    Highest education level: Bachelor's degree (e.g., BA, AB, BS)   Occupational History     Comment: Full time   Tobacco Use    Smoking status: Former     Packs/day: 1.00     Years: 2.00     Additional pack years: 0.00     Total pack years: 2.00     Types: Cigarettes     Quit date: 05/16/1985     Years since quitting: 36.6    Smokeless tobacco: Never   Vaping Use    Vaping Use: Never used   Substance and Sexual Activity    Alcohol use: Yes     Alcohol/week: 5.0 standard drinks of alcohol     Types: 2 Glasses of wine, 1 Cans of beer, 2 Standard drinks or equivalent per week    Drug use: No    Sexual activity: Not Currently     Social Determinants of Health     Financial Resource Strain: Low Risk  (07/14/2019)    Overall Financial Resource Strain (CARDIA)     Difficulty of Paying Living Expenses: Not hard at all   Food Insecurity: No Food Insecurity (07/14/2019)    Hunger Vital Sign     Worried About Running Out of Food in the Last Year: Never true     Ran Out of Food in the Last Year: Never true   Transportation Needs: No Transportation Needs (07/14/2019)    PRAPARE - Therapist, art (Medical): No     Lack of Transportation (Non-Medical): No   Physical Activity: Inactive (07/14/2019)    Exercise Vital Sign     Days of Exercise per Week: 0 days     Minutes of Exercise per Session: 0 min   Stress: Stress Concern Present (07/14/2019)    [employer] of Occupational Health - Occupational Stress Questionnaire     Feeling of Stress : To some extent   Social Connections: Moderately  Isolated (07/14/2019)    Social Connection and Isolation Panel [NHANES]     Frequency of Communication with Friends and Family: More than three times a week     Frequency of Social Gatherings with Friends and Family: More than three times a week     Attends Religious Services: Never     Database administrator or Organizations: No     Attends Banker Meetings: Never     Marital Status: Married     married    Past Medical/Surgical History:     Past Medical History:   Diagnosis Date    Arthritis     Body mass index (BMI)40.0-44.9, adult 02/22/2019    CAD (coronary artery disease)     Cataract     CVA (cerebral vascular accident) (CMS-HCC) 03/27/2020    Diabetes mellitus (CMS-HCC) 11/25/2018    HTN (hypertension)     Hyperlipemia     Myocardial infarction (CMS-HCC) 2015    x 3    Myositis     OSA (obstructive sleep apnea)     not on CPAP or bipap     Past Surgical History:   Procedure Laterality Date    CARDIAC CATHETERIZATION  12/2017    Native 3V CAD, occluded SVG's x 3; patent LIMA-LAD, patent stent native LAD post LIMA, patent LCX stents;  no targets for PCI    COLON SURGERY  2013    [place].  For perforated diverticulitis-records not avail    CORONARY ARTERY BYPASS GRAFT  2011    4V LIMA-LAD, SVG-RCA, SVG-OM, SVG-unknown vessel in [place]    CORONARY STENT PLACEMENT  2012    Stent to native LAD post LIMA insertion    EYE SURGERY      Cataracts removed    HERNIA REPAIR      PR CATH PLACE/CORON ANGIO, IMG SUPER/INTERP,W LEFT HEART VENTRICULOGRAPHY N/A 02/04/2021    Procedure: LEFT HEART CATHETERIZATION WITH POSSIBLE REVASCULARIZATION;  Surgeon: Jacqualyn Posey, MD;  Location: [facility];  Service: Cardiology    PR COLONOSCOPY W/BIOPSY SINGLE/MULTIPLE Left 11/13/2021    Procedure: COLONOSCOPY, FLEXIBLE, PROXIMAL TO SPLENIC FLEXURE; WITH BIOPSY, SINGLE OR MULTIPLE;  Surgeon: Janyth Pupa, MD;  [facility]: [facility];  Service: Gastroenterology    PR KNEE SCOPE,ABRASN ARTHROPLASTY Left 01/08/2014    Procedure: ARTHROSCOPY, KNEE, SURG; ABRASION ARTHROPLASTY (W/CHONDROPLASTY AS NEEDED) OR MULTIPLE DRILLING/MICROFRACT;  Surgeon: Orrin Brigham, MD;  Location: ASC OR [facility];  Service: Orthopedics    PR SHLDR ARTHROSCOP,SURG,W/ROTAT CUFF REPR Left 04/01/2020    Procedure: ARTHROSCOPY, SHOULDER, SURGICAL; WITH ROTATOR CUFF REPAIR;  Surgeon: Tomasa Rand, MD;  Location: [facility];  Service: Orthopedics    REFRACTIVE SURGERY      SPINE SURGERY      VASECTOMY         Family History:     Family History   Problem Relation Age of Onset    Arthritis Mother     Miscarriages / [place] Mother     Cancer Maternal Grandfather     Diabetes Maternal Uncle     Liver disease Maternal Uncle     Heart disease Father     Heart disease Brother     Heart disease Maternal Grandmother     Anesthesia problems Neg Hx     Broken bones Neg Hx     Cancer Neg Hx     Clotting disorder Neg Hx     Collagen disease Neg Hx  Diabetes Neg Hx     Dislocations Neg Hx     Fibromyalgia Neg Hx     Gout Neg Hx     Hemophilia Neg Hx     Osteoporosis Neg Hx     Rheumatologic disease Neg Hx     Scoliosis Neg Hx     Severe sprains Neg Hx     Sickle cell anemia Neg Hx     Spinal Compression Fracture Neg Hx        Allergies: Metronidazole, Atorvastatin, Ezetimibe, Pitavastatin, and Statins-hmg-coa reductase inhibitors    Current Medications:     Current Outpatient Medications   Medication Sig Dispense Refill    blood sugar diagnostic (ONETOUCH ULTRA TEST) Strp CHECK BLOOD SUGAR AS DIRECTED ONCE A DAY AND FOR SYMPTOMS OF HIGH OR LOW BLOOD SUGAR. 100 strip 1    blood-glucose meter kit Use as instructed 1 each 0    clopidogreL (PLAVIX) 75 mg tablet Take 1 tablet (75 mg total) by mouth daily. 90 tablet 3    empty container (SHARPS-A-GATOR DISPOSAL SYSTEM) Misc Use as directed for sharps disposal 1 each 2    evolocumab 140 mg/mL PnIj Inject the contents of one pen (140 mg) under the skin every fourteen (14) days. 6 mL 3    furosemide (LASIX) 40 MG tablet Take 1 tablet (40 mg total) by mouth daily. 90 tablet 3    hyoscyamine (LEVSIN) 0.125 mg tablet Take 1 tablet (0.125 mg total) by mouth every six (6) hours as needed for cramping. 60 tablet 0    ibuprofen (ADVIL,MOTRIN) 200 MG tablet Take 2 tablets (400 mg total) by mouth every eight (8) hours as needed for fever.      lancets (ONETOUCH DELICA LANCETS) 33 gauge Misc CHECK BLOOD SUGAR AS DIRECTED ONCE A DAY AND FOR SYMPTOMS OF HIGH OR LOW BLOOD SUGAR. 100 each 1    multivitamin (TAB-A-VITE/THERAGRAN) per tablet Take 1 tablet by mouth daily.      nitroglycerin (NITROSTAT) 0.4 MG SL tablet Place 1 tablet (0.4 mg total) under the tongue every five (5) minutes as needed for chest pain. 25 tablet 0    pen needle, diabetic (NOVOFINE 32) 32 gauge x 1/4 (6 mm) Ndle 1 Units by Miscellaneous route daily. 100 each 2    semaglutide (OZEMPIC) 1 mg/dose (4 mg/3 mL) PnIj injection Inject 1 mg under the skin every seven (7) days. 9 mL 3    aspirin (ECOTRIN) 81 MG tablet Take 1 tablet (81 mg total) by mouth daily.      carvediloL (COREG) 3.125 MG tablet Take 1 tablet (3.125 mg total) by mouth Two (2) times a day. 180 tablet 3    EPINEPHrine (EPIPEN) 0.3 mg/0.3 mL injection Inject 0.3 mL (0.3 mg total) into the muscle once for 1 dose. 2 each 1    lisinopriL (PRINIVIL,ZESTRIL) 5 MG tablet Take 1 tablet (5 mg total) by mouth daily. 90 tablet 3    metFORMIN (GLUCOPHAGE-XR) 500 MG 24 hr tablet Take 2 tablets (1,000 mg total) by mouth daily with evening meal. 180 tablet 3     No current facility-administered medications for this visit.       Health Maintenance:        PREVENTIVE SERVICES SUMMARY    Per CPE A/P    Immunizations:     Immunization History   Administered Date(s) Administered    COVID-19 VACCINE,MRNA(MODERNA)(PF) 06/01/2019, 07/03/2019, 03/10/2020    INFLUENZA INJ MDCK PF, QUAD,(FLUCELVAX)(21MO AND UP EGG FREE) 12/09/2020  Influenza Vaccine Quad (IIV4 PF) 14mo+ injectable 02/16/2019, 12/11/2021    PNEUMOCOCCAL POLYSACCHARIDE 23 02/16/2019    Pneumococcal Conjugate 20-valent 12/09/2020    SHINGRIX-ZOSTER VACCINE (HZV), RECOMBINANT,SUB-UNIT,ADJUVANTED IM 06/07/2020, 12/09/2020    TdaP 11/24/2018       I have reviewed and (if needed) updated the patient's problem list, medications, allergies, past medical and surgical history, preventive services, and social and family history.    ROS:     A 12 point review of systems was negative except for pertinent items noted in the HPI.    Vital Signs:     Wt Readings from Last 3 Encounters:   12/11/21 (!) 113.4 kg (250 lb)   11/13/21 (!) 108.9 kg (240 lb)   09/04/21 (!) 110.6 kg (243 lb 14.4 oz)     Temp Readings from Last 3 Encounters:   12/11/21 36.7 ??C (98 ??F) (Temporal)   11/13/21 36.3 ??C (97.3 ??F) (Temporal)   06/09/21 36.5 ??C (97.7 ??F) (Temporal)     BP Readings from Last 3 Encounters:   12/11/21 98/67   11/13/21 109/81   09/04/21 115/80     Pulse Readings from Last 3 Encounters:   12/11/21 65   11/13/21 67   09/04/21 79     Estimated body mass index is 36.92 kg/m?? as calculated from the following:    Height as of this encounter: 175.3 cm (5' 9).    Weight as of this encounter: 113.4 kg (250 lb).  Facility age limit for growth %iles is 20 years.        Objective: General Appearance: Alert, cooperative, no distress, appears stated age.   EYES: PERRL, conjunctiva/corneas clear, EOM's intact  ENT:  External canals clear, Tympanic membrane pearly grey with normal light reflex bilaterally. No oropharyngeal lesions, mucous membranes moist.   NECK: No palpable cervical or supraclavicular lymphadenopathy. Thyroid smooth, normal size  CV: Regular rate and rhythm. Normal S1 and S2. No murmurs, gallops, or rubs  RESP: Normal respiratory effort.  Clear to auscultation bilaterally without wheezes, rhonchi or crackles.   GI: Normal abdominal bowel sounds. Soft, non-tender and non-distended.   [id number] done: no  EXT:  No lower extremity edema. Posterior tibial pulses and dorsalis pedis pulses are 2+ and symmetric.    MSK: Full upright posture, smooth and symmetric gait. All major joints show full range of motion without discomfort. Strength is 5/5 in the upper and lower extremities.   SKIN: No rashes or suspicious focal lesions noted.   LYMPH NODES: Cervical, supraclavicular, and axillary nodes normal  NEURO: Cranial nerves II- XII grossly intact.  No focal neurologic deficits  PSYCHIATRIC: Alert and oriented x 3. Mood normal.    Manasvini Whatley A Jamika Sadek, PAC

## 2021-12-11 NOTE — Unmapped (Signed)
Patient in clinic for office visit, FluLaval IIV4 Pres-Free vaccine administered per protocol,NCIR reviewed and vaccine accuracy verified with teammates: Renold Genta, CMA, patient eligible to receive vacstock: Private Vaccine, vaccine administered Left Deltoid, pt tolerated well, no s/s of a reaction noted, VIS given to patient

## 2021-12-11 NOTE — Unmapped (Addendum)
Off testosterone x 4 months 2/2 loss of coverage. Some fatigue. Labs as below

## 2021-12-11 NOTE — Unmapped (Addendum)
Diabetes is at goal < 7.0, today's A1C 5.9.     Medication: metformin 1000 mg every day (changed Rx to reflect dose pt is taking with adequate control), Ozempic 1 mg weekly.   - Decreased Ozempic 2 mg weekly --> 1 mg weekly last visit given excellent glycemic control. Diarrhea has improved, but some wt gain. Can reconsider increasing again in future.    Lab Results   Component Value Date    Hemoglobin A1C 5.9 12/11/2021      ADA diet. Exercise daily 30 minutes. Dental exam annually. Foot exam daily/moisturizerdaily. Advised to follow up with his/her ophthalmologist annually.    DM Check list:  Foot Exam: 06/09/21  Retinal Exam: 12/10/21  Flu shot: 12/11/21  Pneumovax: 02/16/19. Prevnar20- 12/09/20  Baseline EKG: 05/27/17, +RBBB  Microalbumin: on lisinopril  Lab Results   Component Value Date    Albumin/Creatinine Ratio  06/09/2021      Comment:      Unable to calculate.   LDL: intolerant to statins, pt on PCSK9   Lab Results   Component Value Date    LDL Calculated 96 06/09/2021   ASA: yes, ASA  81 mg, and Plavix indefinitely due to hx of ocular stroke and CAD

## 2021-12-12 NOTE — Unmapped (Signed)
Age appropriate preventative care, health maintenance, and anticipatory guidance discussed.  Immunizations:  Influenza vaccine - 12/11/21  Pneumococcal Polysaccharide 23 - 02/16/19  Prevnar 20- 12/09/20  TdaP - 11/24/18  Zoster- 12/09/2020, 06/07/2020   COVID- 03/10/2020, 07/03/2019, 06/05/2019; declines further COVID vaccinations  Screenings:  PSA - 06/09/21 WNL.   CRC - 11/13/21, repeat 7 yrs; due 10/2028  Hep C - 11/24/18, negative  AAA- due at 66 yo; plan to order at 2024 CPE  Ordered labs as below.

## 2021-12-12 NOTE — Unmapped (Addendum)
HX of OSA. Previously followed by [facility]. Failure on CPAP and BiPAP per pt.He is also status post uvuloplasty. Patient does not use CPAP.  - Previously discussed updated sleep study as pt patient lost ~40 pounds.

## 2021-12-12 NOTE — Unmapped (Addendum)
Intolerant to statins. Now on evolocumab 140 mg q 14 days. Reports missed one dose in last 2-3 months.  -Labs as below    Lab Results   Component Value Date    LDL 116 (H) 12/11/2021    HDL 47 12/11/2021

## 2021-12-12 NOTE — Unmapped (Addendum)
Previously followed by [facility], but now established with Dr. Maryagnes [facility], last seen 09/04/21. LHC 01/2021 showed patent LIMA-LAD, chronic occlusions of SVG x 2, patent LCX / OM stents with elevated LVEDP, normal EF.  Denies CP, SOB, but admits to occasional L/D and swelling in ext. Reduced lisinopril today given low BP. Edema improves with furosemide   Medications:  Continue clopidogrel 75 mg every day, ASA 81 mg every day, carvedilol 3.25 mg BID as prescribed. Furosemide 40 mg prn (increased by Cardio for elevated filling pressures and dyspnea). Refills as below.   Per cardio- ASA and Plavix to be continued indefinitely  Goal LDL is < 70, however pt intolerant to statins and previously stopped PCSK9  2/2 SE of diarrhea  and ?ankle pain and swelling, but these symptoms have resolved and he reports has been taking as prescribed for the last 2-3 months with exception of one missed dose  Refills as below  Follow up with with cardio as scheduled      LHC 02/04/2021  Conclusions:  Severe Native 2V CAD with CTO LAD and nondominant RCA; patent LCX / OM stents  Patent LIMA-LAD; chronic occlusions of SVG x 2  NL LVEF 50-55% with inferobasal/posterior HK  Elevated LVEDP 

## 2021-12-12 NOTE — Unmapped (Signed)
Hx of ocular stoke. Aware- aggressive secondary prevention

## 2021-12-23 LAB — TESTOSTERONE, FREE, TOTAL
TESTOSTERONE (MAYO): 252 ng/dL
TESTOSTERONE FREE: 7.22 ng/dL

## 2022-01-15 NOTE — Unmapped (Signed)
The [facility] has made a second and final attempt to reach this patient to refill the following medication:Repatha.      We have sent a text message to the following phone numbers: [phone] and have sent a Mychart questionnaire..    Dates contacted: 01/02/22, 01/15/22  Last scheduled delivery: 12/11/21    The patient may be at risk of non-compliance with this medication. The patient should call the [facility] at [phone]  Option 4, then Option 2 (all other specialty patients) to refill medication.    Aliena Ghrist Celedonio Savage   [facility]

## 2022-03-12 ENCOUNTER — Ambulatory Visit
Admit: 2022-03-12 | Discharge: 2022-03-13 | Payer: PRIVATE HEALTH INSURANCE | Attending: Internal Medicine | Primary: Internal Medicine

## 2022-03-12 DIAGNOSIS — I2510 Atherosclerotic heart disease of native coronary artery without angina pectoris: Principal | ICD-10-CM

## 2022-03-12 DIAGNOSIS — I10 Essential (primary) hypertension: Principal | ICD-10-CM

## 2022-03-12 DIAGNOSIS — Z951 Presence of aortocoronary bypass graft: Principal | ICD-10-CM

## 2022-03-12 DIAGNOSIS — G4733 Obstructive sleep apnea (adult) (pediatric): Principal | ICD-10-CM

## 2022-03-12 DIAGNOSIS — E785 Hyperlipidemia, unspecified: Principal | ICD-10-CM

## 2022-03-12 DIAGNOSIS — I252 Old myocardial infarction: Principal | ICD-10-CM

## 2022-03-12 NOTE — Unmapped (Signed)
Cardiology Outpatient Follow Up Visit    PCP: McCain, [place] Steven, DO    Cardiology Assessment & Plan:    1.  CAD with remote CABG x 4 2011   -LHC 11/2020 with Patent LIMA-LAD, SVG x 2 occluded, patent LCX/OM stents; LVEF 50-55%, elevated LVEDP   -stable with no angina   -on BB, DAPT, ACEI and PCSK9 inhibitor   -CPM and increase activity level  2.  Essential HTN   -Stable on BB and ACEI; have room to push ACEI if needed   -Encouraged regular exercise and weight loss   -Defer to PCP  3.  Dyslipidemia with LDL goal <70 and statin intolerance/zetia intolerance   -on PCSK9 inhibitor   -FLP 11/2021 Tchol 197, LDL 116  4.  DM II   -followed by PCP  5.  OSA on CPAP   -Not compliant with CPAP (intolerant)   -poor sleep habits leading to fatigue    Return in about 1 year (around 03/13/2023) for Recheck.    History of Present Illness:     Chad Hines is a 67 y.o. male with a past medical history significant for CAD, S/P PCI, S/P CABG, HTN, DM, Dyslipidemia, OSA who presents for routine cardiac follow up.    The patient states he is fatigued.  He has a new job, under lots of stress, compliant with his meds, and admits to not getting enough sleep.  He has been intolerant to CPAP/BIPAP.  No CP or angina.  No orthopnea or PND.  He is not exercising and admits to be deconditioned.  He is a Office manager.    LHC 11/2020:  Conclusions:  Severe Native 2V CAD with CTO LAD and nondominant RCA; patent LCX / OM stents  Patent LIMA-LAD; chronic occlusions of SVG x 2  NL LVEF 50-55% with inferobasal/posterior HK  Elevated LVEDP       Allergies   Allergen Reactions    Metronidazole Hives    Atorvastatin Other (See Comments)     Memory  loss    Ezetimibe Other (See Comments)     Memory loss    Pitavastatin Other (See Comments)     Memory loss    Statins-Hmg-Coa Reductase Inhibitors Other (See Comments)     Fatigue and myalgias as well as memory loss       Medications:     Current Outpatient Medications:     aspirin (ECOTRIN) 81 MG tablet, Take 1 tablet (81 mg total) by mouth daily., Disp: , Rfl:     blood sugar diagnostic (ONETOUCH ULTRA TEST) Strp, CHECK BLOOD SUGAR AS DIRECTED ONCE A DAY AND FOR SYMPTOMS OF HIGH OR LOW BLOOD SUGAR., Disp: 100 strip, Rfl: 1    blood-glucose meter kit, Use as instructed, Disp: 1 each, Rfl: 0    carvediloL (COREG) 3.125 MG tablet, Take 1 tablet (3.125 mg total) by mouth Two (2) times a day., Disp: 180 tablet, Rfl: 3    clopidogreL (PLAVIX) 75 mg tablet, Take 1 tablet (75 mg total) by mouth daily., Disp: 90 tablet, Rfl: 3    empty container (SHARPS-A-GATOR DISPOSAL SYSTEM) Misc, Use as directed for sharps disposal, Disp: 1 each, Rfl: 2    EPINEPHrine (EPIPEN) 0.3 mg/0.3 mL injection, Inject 0.3 mL (0.3 mg total) into the muscle once for 1 dose., Disp: 2 each, Rfl: 1    evolocumab 140 mg/mL PnIj, Inject the contents of one pen (140 mg) under the skin every fourteen (14) days., Disp: 6 mL, Rfl: 3  furosemide (LASIX) 40 MG tablet, Take 1 tablet (40 mg total) by mouth daily., Disp: 90 tablet, Rfl: 3    hyoscyamine (LEVSIN) 0.125 mg tablet, Take 1 tablet (0.125 mg total) by mouth every six (6) hours as needed for cramping., Disp: 60 tablet, Rfl: 0    ibuprofen (ADVIL,MOTRIN) 200 MG tablet, Take 2 tablets (400 mg total) by mouth every eight (8) hours as needed for fever., Disp: , Rfl:     lancets (ONETOUCH DELICA LANCETS) 33 gauge Misc, CHECK BLOOD SUGAR AS DIRECTED ONCE A DAY AND FOR SYMPTOMS OF HIGH OR LOW BLOOD SUGAR., Disp: 100 each, Rfl: 1    lisinopriL (PRINIVIL,ZESTRIL) 5 MG tablet, Take 1 tablet (5 mg total) by mouth daily., Disp: 90 tablet, Rfl: 3    metFORMIN (GLUCOPHAGE-XR) 500 MG 24 hr tablet, Take 2 tablets (1,000 mg total) by mouth daily with evening meal., Disp: 180 tablet, Rfl: 3    multivitamin (TAB-A-VITE/THERAGRAN) per tablet, Take 1 tablet by mouth daily., Disp: , Rfl:     nitroglycerin (NITROSTAT) 0.4 MG SL tablet, Place 1 tablet (0.4 mg total) under the tongue every five (5) minutes as needed for chest pain., Disp: 25 tablet, Rfl: 0    pen needle, diabetic (NOVOFINE 32) 32 gauge x 1/4 (6 mm) Ndle, 1 Units by Miscellaneous route daily., Disp: 100 each, Rfl: 2    semaglutide (OZEMPIC) 1 mg/dose (4 mg/3 mL) PnIj injection, Inject 1 mg under the skin every seven (7) days., Disp: 9 mL, Rfl: 3     Past Medical History:   Diagnosis Date    Arthritis     Body mass index (BMI)40.0-44.9, adult 02/22/2019    CAD (coronary artery disease)     Cataract     CVA (cerebral vascular accident) (CMS-HCC) 03/27/2020    Diabetes mellitus (CMS-HCC) 11/25/2018    HTN (hypertension)     Hyperlipemia     Myocardial infarction (CMS-HCC) 2015    x 3    Myositis     OSA (obstructive sleep apnea)     not on CPAP or bipap       Past Surgical History:   Procedure Laterality Date    CARDIAC CATHETERIZATION  12/2017    Native 3V CAD, occluded SVG's x 3; patent LIMA-LAD, patent stent native LAD post LIMA, patent LCX stents;  no targets for PCI    COLON SURGERY  2013    [place].  For perforated diverticulitis-records not avail    CORONARY ARTERY BYPASS GRAFT  2011    4V LIMA-LAD, SVG-RCA, SVG-OM, SVG-unknown vessel in [place]    CORONARY STENT PLACEMENT  2012    Stent to native LAD post LIMA insertion    EYE SURGERY      Cataracts removed    HERNIA REPAIR      PR CATH PLACE/CORON ANGIO, IMG SUPER/INTERP,W LEFT HEART VENTRICULOGRAPHY N/A 02/04/2021    Procedure: LEFT HEART CATHETERIZATION WITH POSSIBLE REVASCULARIZATION;  Surgeon: Jacqualyn Posey, MD;  Location: [facility];  Service: Cardiology    PR COLONOSCOPY W/BIOPSY SINGLE/MULTIPLE Left 11/13/2021    Procedure: COLONOSCOPY, FLEXIBLE, PROXIMAL TO SPLENIC FLEXURE; WITH BIOPSY, SINGLE OR MULTIPLE;  Surgeon: Janyth Pupa, MD;  [facility];  Service: Gastroenterology    PR KNEE SCOPE,ABRASN ARTHROPLASTY Left 01/08/2014    Procedure: ARTHROSCOPY, KNEE, SURG; ABRASION ARTHROPLASTY (W/CHONDROPLASTY AS NEEDED) OR MULTIPLE DRILLING/MICROFRACT;  Surgeon: Orrin Brigham, MD;  Location: [facility];  Service: Orthopedics    PR SHLDR ARTHROSCOP,SURG,W/ROTAT  CUFF REPR Left 04/01/2020    Procedure: ARTHROSCOPY, SHOULDER, SURGICAL; WITH ROTATOR CUFF REPAIR;  Surgeon: Tomasa Rand, MD;  Location: [facility] OR [facility];  Service: Orthopedics    REFRACTIVE SURGERY      SPINE SURGERY      VASECTOMY         Social History:  Social History     Tobacco Use   Smoking Status Former    Current packs/day: 0.00    Average packs/day: 1 pack/day for 2.0 years (2.0 ttl pk-yrs)    Types: Cigarettes    Start date: 05/17/1983    Quit date: 05/16/1985    Years since quitting: 36.8   Smokeless Tobacco Never     Social History     Substance and Sexual Activity   Alcohol Use Yes    Alcohol/week: 5.0 standard drinks of alcohol    Types: 2 Glasses of wine, 1 Cans of beer, 2 Standard drinks or equivalent per week     Social History     Substance and Sexual Activity   Drug Use No       Family History   Problem Relation Age of Onset    Arthritis Mother     Miscarriages / [place] Mother     Cancer Maternal Grandfather     Diabetes Maternal Uncle     Liver disease Maternal Uncle     Heart disease Father     Heart disease Brother     Heart disease Maternal Grandmother     Anesthesia problems Neg Hx     Broken bones Neg Hx     Cancer Neg Hx     Clotting disorder Neg Hx     Collagen disease Neg Hx     Diabetes Neg Hx     Dislocations Neg Hx     Fibromyalgia Neg Hx     Gout Neg Hx     Hemophilia Neg Hx     Osteoporosis Neg Hx     Rheumatologic disease Neg Hx     Scoliosis Neg Hx     Severe sprains Neg Hx     Sickle cell anemia Neg Hx     Spinal Compression Fracture Neg Hx        Review of Systems: All positive and pertinent negatives are noted in the HPI; otherwise all other systems are negative.    Objective     Physical Exam:  BP 142/84  - Pulse 75  - Resp 13  - Ht 175.3 cm (5' 9)  - Wt 111.9 kg (246 lb 12.8 oz)  - SpO2 94%  - BMI 36.45 kg/m??   GEN: Cherokee Scriver is a pleasant male in no acute distress.   HEENT: Atraumatic, normocephalic   LUNGS: Clear to auscultation bilaterally.  No increased work of breathing or signs of respiratory distress.  CV: regular rate and rhythm, S1, S2 normal, no murmur, click, rub or gallop.  No lower extremity edema bilaterally.  GI: Abdomen is soft with normal bowel sounds.   VASCULAR:  2+ DP/PT lower extremity pulses bilaterally, 2+ carotid pulses, no cervical bruits auscultated.  Radial pulses 2+ and symmetrical.  MUSCULOSKELETAL: Gait and station normal.   SKIN: Warm,  Good turgor.  PSYCHIATRIC: Mood and affect normal.  NEURO: Alert and oriented x 3. CN II-XII are grossly intact.     Thank you for allowing me to participate in the care of this patient.      Geni Bers, MD, MD,  [facility]  03/12/2022, 9:53 AM

## 2022-04-13 ENCOUNTER — Ambulatory Visit
Admit: 2022-04-13 | Discharge: 2022-04-14 | Payer: PRIVATE HEALTH INSURANCE | Attending: Student in an Organized Health Care Education/Training Program | Primary: Student in an Organized Health Care Education/Training Program

## 2022-04-13 DIAGNOSIS — R42 Dizziness and giddiness: Principal | ICD-10-CM

## 2022-04-13 DIAGNOSIS — R5383 Other fatigue: Principal | ICD-10-CM

## 2022-04-13 DIAGNOSIS — H5500 Unspecified nystagmus: Principal | ICD-10-CM

## 2022-04-13 LAB — COMPREHENSIVE METABOLIC PANEL
ALBUMIN: 4.3 g/dL (ref 3.4–5.0)
ALKALINE PHOSPHATASE: 53 U/L (ref 46–116)
ALT (SGPT): 24 U/L (ref 10–49)
ANION GAP: 6 mmol/L (ref 5–14)
AST (SGOT): 24 U/L (ref <=34)
BILIRUBIN TOTAL: 0.7 mg/dL (ref 0.3–1.2)
BLOOD UREA NITROGEN: 13 mg/dL (ref 9–23)
BUN / CREAT RATIO: 14
CALCIUM: 10.1 mg/dL (ref 8.7–10.4)
CHLORIDE: 106 mmol/L (ref 98–107)
CO2: 27 mmol/L (ref 20.0–31.0)
CREATININE: 0.95 mg/dL
EGFR CKD-EPI (2021) MALE: 89 mL/min/{1.73_m2} (ref >=60)
GLUCOSE RANDOM: 86 mg/dL (ref 70–179)
POTASSIUM: 3.9 mmol/L (ref 3.4–4.8)
PROTEIN TOTAL: 7.3 g/dL (ref 5.7–8.2)
SODIUM: 139 mmol/L (ref 135–145)

## 2022-04-13 LAB — TSH: THYROID STIMULATING HORMONE: 1.449 u[IU]/mL (ref 0.550–4.780)

## 2022-04-13 LAB — CBC
HEMATOCRIT: 42.6 % (ref 39.0–48.0)
HEMOGLOBIN: 14.8 g/dL (ref 12.9–16.5)
MEAN CORPUSCULAR HEMOGLOBIN CONC: 34.8 g/dL (ref 32.0–36.0)
MEAN CORPUSCULAR HEMOGLOBIN: 30.6 pg (ref 25.9–32.4)
MEAN CORPUSCULAR VOLUME: 87.8 fL (ref 77.6–95.7)
MEAN PLATELET VOLUME: 8.4 fL (ref 6.8–10.7)
PLATELET COUNT: 293 10*9/L (ref 150–450)
RED BLOOD CELL COUNT: 4.85 10*12/L (ref 4.26–5.60)
RED CELL DISTRIBUTION WIDTH: 13.3 % (ref 12.2–15.2)
WBC ADJUSTED: 7.9 10*9/L (ref 3.6–11.2)

## 2022-04-13 LAB — T4, FREE: FREE T4: 1.01 ng/dL (ref 0.89–1.76)

## 2022-04-13 LAB — T3, FREE: T3 FREE: 3.35 pg/mL (ref 2.30–4.20)

## 2022-04-13 MED ORDER — PREDNISONE 10 MG TABLET
ORAL_TABLET | 0 refills | 0 days | Status: CP
Start: 2022-04-13 — End: ?

## 2022-04-13 NOTE — Unmapped (Signed)
Regarding: Dizziness--transferred to [place]  ----- Message from Elonda Husky sent at 04/13/2022  9:14 AM EST -----  Patient called in stating dizziness/fatigued. Scheduled today regionally at [facility] location. Please assist

## 2022-04-13 NOTE — Unmapped (Signed)
Upcoming Appt:  Future Appointments   Date Time Provider Department Center   04/13/2022  3:00 PM Mangel, Benison Pap, DO [facility]   03/18/2023 10:00 AM Dory Larsen, PA [facility]       Recent:   What is the date of your last related visit? 03/12/22 cardiology [facility]   Related acute medications Rx'd:  none   Home treatment tried:  sleep       Relevant:   Allergies: Metronidazole, Atorvastatin, Ezetimibe, Pitavastatin, and Statins-hmg-coa reductase inhibitors  Medications: ozempic, metformin   Health History: heart disease per pt , diabetic   Weight: n/a      [place] [facility] patients only:  What was the date of your last cancer treatment (mm/dd/yy)?: none   Was the treatment oral or infusion?: n/a  Are you currently on TVEC (yes/no)?: n/a  Reason for Disposition   [1] MODERATE dizziness (e.g., interferes with normal activities) AND [2] has NOT been evaluated by doctor (or NP/PA) for this  (Exception: Dizziness caused by heat exposure, sudden standing, or poor fluid intake.)    Answer Assessment - Initial Assessment Questions  1. DESCRIPTION: Describe your dizziness.      Almost like sea sick per pt , feeling light headed per pt , balance is off, but can walk, will have to close his eyes and sit down and it will go away per pt   2. LIGHTHEADED: Do you feel lightheaded? (e.g., somewhat faint, woozy, weak upon standing)      Feeling lightheaded per pt   3. VERTIGO: Do you feel like either you or the room is spinning or tilting? (i.e. vertigo)      No vertigo per pt   4. SEVERITY: How bad is it?  Do you feel like you are going to faint? Can you stand and walk?    - MILD: Feels slightly dizzy, but walking normally.    - MODERATE: Feels unsteady when walking, but not falling; interferes with normal activities (e.g., school, work).    - SEVERE: Unable to walk without falling, or requires assistance to walk without falling; feels like passing out now.       Moderate , walking normally per pt,  every once in a while will not walk because he may fall per pt   5. ONSET:  When did the dizziness begin?      started 3 weeks ago, worsened 04/09/22 per pt   6. AGGRAVATING FACTORS: Does anything make it worse? (e.g., standing, change in head position)      Nothing makes it worse per pt   7. HEART RATE: Can you tell me your heart rate? How many beats in 15 seconds?  (Note: not all patients can do this)        04/13/22 0924 P-72 per pt   8. CAUSE: What do you think is causing the dizziness?      No idea, there has been a virus at work, not sure per pt , pt is diabetic and take ozempic and metformin and has been on these for a while, his BG last checked was 120 and feels like it is not due to low blood sugar per pt  9. RECURRENT SYMPTOM: Have you had dizziness before? If Yes, ask: When was the last time? What happened that time?      Not very often, the last time wa situational a couple times a year , has not been to the doctor, it goes away  on it's own per pt   10. OTHER SYMPTOMS: Do you have any other symptoms? (e.g., fever, chest pain, vomiting, diarrhea, bleeding)        Pt denies fever, chest pain, vomiting, diarrhea, bleeding per pt   11. PREGNANCY: Is there any chance you are pregnant? When was your last menstrual period?        N/a  Pt already has pcp appointment scheduled for today, 04/13/22 at 3pm and will keep this appointment. This RN advised the pt to call back if worsens, concerns, new symptoms. Pt with verbal understanding and agrees and stated he has no other questions and  ended the call.    Protocols used: Dizziness - Lightheadedness-A-AH

## 2022-04-13 NOTE — Unmapped (Signed)
Chad Hines [facility]  12/29/1955    Assessment & Plan:  Dizziness  -     ECG 12 Lead  -     TSH; Future  -     CBC; Future  -     CT Internal Auditory Canals Wo Contrast; Future    Fatigue, unspecified type  -     ECG 12 Lead  -     TSH; Future  -     T4, Free; Future  -     T3, Free; Future  -     Comprehensive Metabolic Panel; Future  -     CBC; Future  -     CT Internal Auditory Canals Wo Contrast; Future    Nystagmus of right eye  -     CT Internal Auditory Canals Wo Contrast; Future    Other orders  -     predniSONE; Take 60 mg daily on days 1 through 5 (6 tablets total), 40 mg on day 6 (4 tablets total), 30 mg on day 7 (3 tablets), 20 mg on day 8 (2 tablets) , 10 mg on day 9 (1 tablet), and 5 mg on day 10 (1/2 tablet)        -----------------------------------------------------------------------------------    EKG appears similar to previous   Emergency room precautions discussed in dpeth, patient verbalized understanding and agreement too    No follow-ups on file.    Subjective:  Subjective    HPI: Chad Hines is a 68 y.o. male here for Extreme dizziness/fatigued (regional scheduling)     Noted per triage note:   -His dizziness: Almost like sea sick per pt , feeling light headed per pt , balance is off, but can walk, will have to close his eyes and sit down and it will go away per pt   -Feeling lightheaded but denies vertigo  -His dizziness is moderate  -This has been occurring for THREE WEEKS but worse on 04/09/2022  -cause of Dizziness: No idea, there has been a virus at work, not sure per pt , pt is diabetic and take ozempic and metformin and has been on these for a while, his BG last checked was 120 and feels like it is not due to low blood sugar per pt     TODAY HE NOTES:  -For the past month or so he has had severe fatigue with quick onset.   He works from home   -Last Thursday he got very dizzy and did not go away.  It is slowly improving but not really going away.   -He notes that those at work have been sick   -he describes the dizziness as unable to get his balance. There is a concern regarding balancing but no room spinning.   -denies double vision/blurred vision  -Denies chest pain, palpitations  -What makes it better: Sleep   -Treatments: Nothing else  -He did stop his medication for one day to see if it helped but it did not help  -He notes that it is hard to focus and hard to concentrate   -Denies weakness, confusion.     No hearing loss or tinnitus      ROS:  Review of systems negative unless otherwise noted as per HPI.    Objective:  Objective    Vitals:    04/13/22 1458   BP: 136/90   Pulse: 76   Temp: 36.4 ??C (97.6 ??F)   SpO2: 98%     Body mass index  is 34.91 kg/m??.    Physical Exam:    Nystagumus on right eye noted negative unless otherwise noted as per HPI.    Objective:  Objective    Vitals:    04/13/22 1458   BP: 136/90   Pulse: 76   Temp: 36.4 ??C (97.6 ??F)   SpO2: 98%     Body mass index is 34.91 kg/m??.    Physical Exam:  Const: in no acute distress, not toxic appearing or ill  HEENT: wearing glasses. When testing EOMI, noted to have had mild horizontal Nystagmus on right eye noted when testing medial/lateral. No vertical nystagmus is noted. Pupils are equal, round and reactive to light. TM are not erythematous, perforated, retracted  Cardiac: Appeared to have RRR on exam  Lungs: CTAB  MSK: Muscle strength appears grossly intact and equal in upper extremities and lower extremities. Gait is stable. His gait is not wide based  Neuro: CN II-XII intact, no neuro-deficits on exam, romberg test was negative, able to compete heel-to-shin without difficulty bilaterally. He was able to perform rapid hand alternating movements without issue on today's exam.   Psych: Mood is normal

## 2022-04-15 ENCOUNTER — Ambulatory Visit: Admit: 2022-04-15 | Discharge: 2022-04-16 | Payer: PRIVATE HEALTH INSURANCE

## 2022-04-23 NOTE — Unmapped (Signed)
Specialty Medication(s): Repatha    Mr.Salay has been dis-enrolled from the [facility] specialty pharmacy services due to multiple unsuccessful outreach attempts by the pharmacy.    Additional information provided to the patient: n/a    [name], PharmD  [facility] Specialty Pharmacist

## 2022-06-09 DIAGNOSIS — I10 Essential (primary) hypertension: Principal | ICD-10-CM

## 2022-06-09 DIAGNOSIS — E1169 Type 2 diabetes mellitus with other specified complication: Principal | ICD-10-CM

## 2022-06-09 DIAGNOSIS — I25119 Atherosclerotic heart disease of native coronary artery with unspecified angina pectoris: Principal | ICD-10-CM

## 2022-06-09 MED ORDER — METFORMIN ER 500 MG TABLET,EXTENDED RELEASE 24 HR
ORAL_TABLET | 3 refills | 0 days
Start: 2022-06-09 — End: ?

## 2022-06-09 MED ORDER — LISINOPRIL 10 MG TABLET
ORAL_TABLET | Freq: Every day | ORAL | 3 refills | 90 days
Start: 2022-06-09 — End: ?

## 2022-06-09 MED ORDER — CLOPIDOGREL 75 MG TABLET
ORAL_TABLET | Freq: Every day | ORAL | 3 refills | 90 days
Start: 2022-06-09 — End: ?

## 2022-06-10 MED ORDER — FUROSEMIDE 40 MG TABLET
ORAL_TABLET | Freq: Every day | ORAL | 3 refills | 90 days
Start: 2022-06-10 — End: ?

## 2022-06-18 ENCOUNTER — Ambulatory Visit: Admit: 2022-06-18 | Discharge: 2022-06-19 | Payer: PRIVATE HEALTH INSURANCE | Attending: Family | Primary: Family

## 2022-06-18 DIAGNOSIS — I10 Essential (primary) hypertension: Principal | ICD-10-CM

## 2022-06-18 DIAGNOSIS — L600 Ingrowing nail: Principal | ICD-10-CM

## 2022-06-18 MED ORDER — LISINOPRIL 5 MG TABLET
ORAL_TABLET | Freq: Every day | ORAL | 3 refills | 90 days | Status: CP
Start: 2022-06-18 — End: 2023-06-18

## 2022-06-18 MED ORDER — CLOPIDOGREL 75 MG TABLET
ORAL_TABLET | Freq: Every day | ORAL | 3 refills | 90 days | Status: CP
Start: 2022-06-18 — End: ?

## 2022-06-18 MED ORDER — FUROSEMIDE 40 MG TABLET
ORAL_TABLET | Freq: Every day | ORAL | 3 refills | 90 days | Status: CP
Start: 2022-06-18 — End: ?

## 2022-06-18 MED ORDER — CEPHALEXIN 500 MG CAPSULE
ORAL_CAPSULE | Freq: Two times a day (BID) | ORAL | 0 refills | 10 days | Status: CP
Start: 2022-06-18 — End: 2022-06-28

## 2022-06-21 DIAGNOSIS — E1169 Type 2 diabetes mellitus with other specified complication: Principal | ICD-10-CM

## 2022-06-21 MED ORDER — OZEMPIC 1 MG/DOSE (4 MG/3 ML) SUBCUTANEOUS PEN INJECTOR
3 refills | 0 days
Start: 2022-06-21 — End: ?

## 2022-06-22 MED ORDER — OZEMPIC 1 MG/DOSE (4 MG/3 ML) SUBCUTANEOUS PEN INJECTOR
3 refills | 0 days
Start: 2022-06-22 — End: ?

## 2022-07-24 MED ORDER — OZEMPIC 1 MG/DOSE (4 MG/3 ML) SUBCUTANEOUS PEN INJECTOR
SUBCUTANEOUS | 0 refills | 84 days | Status: CP
Start: 2022-07-24 — End: 2022-10-22

## 2022-08-26 MED ORDER — OZEMPIC 2 MG/DOSE (8 MG/3 ML) SUBCUTANEOUS PEN INJECTOR
SUBCUTANEOUS | 4 refills | 0 days
Start: 2022-08-26 — End: ?

## 2022-09-17 ENCOUNTER — Ambulatory Visit
Admit: 2022-09-17 | Discharge: 2022-09-18 | Payer: PRIVATE HEALTH INSURANCE | Attending: Student in an Organized Health Care Education/Training Program | Primary: Student in an Organized Health Care Education/Training Program

## 2022-09-17 DIAGNOSIS — E1169 Type 2 diabetes mellitus with other specified complication: Principal | ICD-10-CM

## 2022-09-17 DIAGNOSIS — E291 Testicular hypofunction: Principal | ICD-10-CM

## 2022-09-17 DIAGNOSIS — E782 Mixed hyperlipidemia: Principal | ICD-10-CM

## 2022-09-17 DIAGNOSIS — I10 Essential (primary) hypertension: Principal | ICD-10-CM

## 2022-09-17 DIAGNOSIS — I252 Old myocardial infarction: Principal | ICD-10-CM

## 2022-09-17 DIAGNOSIS — R5383 Other fatigue: Principal | ICD-10-CM

## 2022-09-17 DIAGNOSIS — R0600 Dyspnea, unspecified: Principal | ICD-10-CM

## 2022-09-17 DIAGNOSIS — I25119 Atherosclerotic heart disease of native coronary artery with unspecified angina pectoris: Principal | ICD-10-CM

## 2022-09-17 MED ORDER — CARVEDILOL 3.125 MG TABLET
ORAL_TABLET | Freq: Two times a day (BID) | ORAL | 3 refills | 90 days | Status: CP
Start: 2022-09-17 — End: 2023-09-17

## 2022-10-23 MED ORDER — OZEMPIC 1 MG/DOSE (4 MG/3 ML) SUBCUTANEOUS PEN INJECTOR
SUBCUTANEOUS | 0 refills | 28 days | Status: CP
Start: 2022-10-23 — End: 2023-10-23

## 2022-10-29 ENCOUNTER — Ambulatory Visit
Admit: 2022-10-29 | Discharge: 2022-10-30 | Payer: PRIVATE HEALTH INSURANCE | Attending: Student in an Organized Health Care Education/Training Program | Primary: Student in an Organized Health Care Education/Training Program

## 2022-10-29 DIAGNOSIS — I25119 Atherosclerotic heart disease of native coronary artery with unspecified angina pectoris: Principal | ICD-10-CM

## 2022-10-29 DIAGNOSIS — E782 Mixed hyperlipidemia: Principal | ICD-10-CM

## 2022-10-29 DIAGNOSIS — Z125 Encounter for screening for malignant neoplasm of prostate: Principal | ICD-10-CM

## 2022-10-29 DIAGNOSIS — G4733 Obstructive sleep apnea (adult) (pediatric): Principal | ICD-10-CM

## 2022-10-29 DIAGNOSIS — R42 Dizziness and giddiness: Principal | ICD-10-CM

## 2022-10-29 DIAGNOSIS — R5383 Other fatigue: Principal | ICD-10-CM

## 2022-10-29 DIAGNOSIS — I10 Essential (primary) hypertension: Principal | ICD-10-CM

## 2022-11-20 MED ORDER — OZEMPIC 1 MG/DOSE (4 MG/3 ML) SUBCUTANEOUS PEN INJECTOR
0 refills | 0 days
Start: 2022-11-20 — End: ?

## 2022-11-23 MED ORDER — OZEMPIC 1 MG/DOSE (4 MG/3 ML) SUBCUTANEOUS PEN INJECTOR
SUBCUTANEOUS | 2 refills | 28 days | Status: CP
Start: 2022-11-23 — End: 2023-02-21

## 2022-11-26 ENCOUNTER — Ambulatory Visit
Admit: 2022-11-26 | Discharge: 2022-11-27 | Payer: PRIVATE HEALTH INSURANCE | Attending: Internal Medicine | Primary: Internal Medicine

## 2022-11-26 DIAGNOSIS — I252 Old myocardial infarction: Principal | ICD-10-CM

## 2022-11-26 DIAGNOSIS — G4733 Obstructive sleep apnea (adult) (pediatric): Principal | ICD-10-CM

## 2022-11-26 DIAGNOSIS — R0609 Other forms of dyspnea: Principal | ICD-10-CM

## 2022-11-26 DIAGNOSIS — R5382 Chronic fatigue, unspecified: Principal | ICD-10-CM

## 2022-11-26 DIAGNOSIS — E785 Hyperlipidemia, unspecified: Principal | ICD-10-CM

## 2022-11-26 DIAGNOSIS — I2510 Atherosclerotic heart disease of native coronary artery without angina pectoris: Principal | ICD-10-CM

## 2022-11-26 DIAGNOSIS — I10 Essential (primary) hypertension: Principal | ICD-10-CM

## 2022-11-26 DIAGNOSIS — Z951 Presence of aortocoronary bypass graft: Principal | ICD-10-CM

## 2022-12-03 ENCOUNTER — Ambulatory Visit: Admit: 2022-12-03 | Discharge: 2022-12-04 | Payer: PRIVATE HEALTH INSURANCE

## 2022-12-07 DIAGNOSIS — I252 Old myocardial infarction: Principal | ICD-10-CM

## 2022-12-07 DIAGNOSIS — E1169 Type 2 diabetes mellitus with other specified complication: Principal | ICD-10-CM

## 2022-12-07 MED ORDER — OZEMPIC 1 MG/DOSE (4 MG/3 ML) SUBCUTANEOUS PEN INJECTOR
SUBCUTANEOUS | 2 refills | 28 days | Status: CP
Start: 2022-12-07 — End: 2023-12-07

## 2022-12-07 MED ORDER — WEGOVY 1 MG/0.5 ML SUBCUTANEOUS PEN INJECTOR
SUBCUTANEOUS | 2 refills | 0 days | Status: CP
Start: 2022-12-07 — End: 2022-12-07

## 2022-12-13 DIAGNOSIS — E1169 Type 2 diabetes mellitus with other specified complication: Principal | ICD-10-CM

## 2022-12-13 MED ORDER — METFORMIN ER 500 MG TABLET,EXTENDED RELEASE 24 HR
ORAL_TABLET | Freq: Every day | ORAL | 3 refills | 0 days
Start: 2022-12-13 — End: ?

## 2022-12-14 DIAGNOSIS — E1169 Type 2 diabetes mellitus with other specified complication: Principal | ICD-10-CM

## 2022-12-14 MED ORDER — METFORMIN ER 500 MG TABLET,EXTENDED RELEASE 24 HR
ORAL_TABLET | Freq: Every day | ORAL | 3 refills | 90.00000 days | Status: CP
Start: 2022-12-14 — End: 2023-03-14

## 2022-12-16 ENCOUNTER — Ambulatory Visit: Admit: 2022-12-16 | Discharge: 2022-12-17 | Payer: MEDICARE

## 2023-01-21 ENCOUNTER — Ambulatory Visit: Admit: 2023-01-21 | Discharge: 2023-01-22 | Payer: MEDICARE | Attending: Family | Primary: Family

## 2023-01-21 DIAGNOSIS — J069 Acute upper respiratory infection, unspecified: Principal | ICD-10-CM

## 2023-01-21 DIAGNOSIS — R051 Acute cough: Principal | ICD-10-CM

## 2023-01-21 MED ORDER — IBUPROFEN 200 MG TABLET
ORAL_TABLET | Freq: Three times a day (TID) | ORAL | 0 refills | 4 days | Status: CP | PRN
Start: 2023-01-21 — End: ?

## 2023-01-21 MED ORDER — BENZONATATE 200 MG CAPSULE
ORAL_CAPSULE | Freq: Three times a day (TID) | ORAL | 0 refills | 10 days | Status: CP | PRN
Start: 2023-01-21 — End: 2023-01-28

## 2023-01-30 ENCOUNTER — Ambulatory Visit
Admit: 2023-01-30 | Discharge: 2023-02-04 | Disposition: A | Discharge status: Home / Self Care | Payer: MEDICARE | Admitting: Adult Health

## 2023-01-30 ENCOUNTER — Ambulatory Visit: Admit: 2023-01-30 | Discharge: 2023-02-04 | Payer: MEDICARE

## 2023-02-11 ENCOUNTER — Ambulatory Visit: Admit: 2023-02-11 | Discharge: 2023-02-12 | Payer: MEDICARE

## 2023-02-11 DIAGNOSIS — R0602 Shortness of breath: Principal | ICD-10-CM

## 2023-02-11 DIAGNOSIS — R058 Productive cough: Principal | ICD-10-CM

## 2023-02-11 DIAGNOSIS — I10 Essential (primary) hypertension: Principal | ICD-10-CM

## 2023-02-11 DIAGNOSIS — J1282 Pneumonia due to COVID-19 virus: Principal | ICD-10-CM

## 2023-02-11 DIAGNOSIS — E1169 Type 2 diabetes mellitus with other specified complication: Principal | ICD-10-CM

## 2023-02-11 DIAGNOSIS — U071 Pneumonia due to COVID-19 virus: Principal | ICD-10-CM

## 2023-02-11 DIAGNOSIS — E278 Other specified disorders of adrenal gland: Principal | ICD-10-CM

## 2023-02-11 DIAGNOSIS — D649 Anemia, unspecified: Principal | ICD-10-CM

## 2023-02-11 MED ORDER — FLUTICASONE PROPIONATE 110 MCG/ACTUATION HFA AEROSOL INHALER
Freq: Two times a day (BID) | RESPIRATORY_TRACT | 0 refills | 0.00 days | Status: CP
Start: 2023-02-11 — End: 2024-02-11

## 2023-02-11 MED ORDER — ALBUTEROL SULFATE HFA 90 MCG/ACTUATION AEROSOL INHALER
Freq: Four times a day (QID) | RESPIRATORY_TRACT | 0 refills | 0.00 days | Status: CP | PRN
Start: 2023-02-11 — End: 2024-02-11

## 2023-02-11 MED ORDER — AZITHROMYCIN 250 MG TABLET
ORAL_TABLET | 0 refills | 5.00 days | Status: CP
Start: 2023-02-11 — End: 2023-02-16

## 2023-03-16 ENCOUNTER — Inpatient Hospital Stay: Admit: 2023-03-16 | Discharge: 2023-03-17 | Payer: MEDICARE

## 2023-03-16 DIAGNOSIS — D649 Anemia, unspecified: Principal | ICD-10-CM

## 2023-03-16 DIAGNOSIS — E1169 Type 2 diabetes mellitus with other specified complication: Principal | ICD-10-CM

## 2023-03-16 DIAGNOSIS — I10 Essential (primary) hypertension: Principal | ICD-10-CM

## 2023-03-16 DIAGNOSIS — E278 Other specified disorders of adrenal gland: Principal | ICD-10-CM

## 2023-03-16 DIAGNOSIS — R058 Productive cough: Principal | ICD-10-CM

## 2023-03-16 DIAGNOSIS — R0602 Shortness of breath: Principal | ICD-10-CM

## 2023-03-16 DIAGNOSIS — J1282 Pneumonia due to COVID-19 virus: Principal | ICD-10-CM

## 2023-03-16 DIAGNOSIS — U071 Pneumonia due to COVID-19 virus: Principal | ICD-10-CM

## 2023-03-18 ENCOUNTER — Ambulatory Visit
Admit: 2023-03-18 | Discharge: 2023-03-19 | Payer: MEDICARE | Attending: Student in an Organized Health Care Education/Training Program | Primary: Student in an Organized Health Care Education/Training Program

## 2023-03-18 DIAGNOSIS — R0609 Other forms of dyspnea: Principal | ICD-10-CM

## 2023-03-18 DIAGNOSIS — E278 Other specified disorders of adrenal gland: Principal | ICD-10-CM

## 2023-03-18 DIAGNOSIS — E782 Mixed hyperlipidemia: Principal | ICD-10-CM

## 2023-03-18 DIAGNOSIS — R5383 Other fatigue: Principal | ICD-10-CM

## 2023-03-18 DIAGNOSIS — J1282 Pneumonia due to COVID-19 virus: Principal | ICD-10-CM

## 2023-03-18 DIAGNOSIS — U071 Pneumonia due to COVID-19 virus: Principal | ICD-10-CM

## 2023-03-18 MED ORDER — QVAR REDIHALER 80 MCG/ACTUATION HFA BREATH ACTIVATED AEROSOL
Freq: Two times a day (BID) | RESPIRATORY_TRACT | 0 refills | 0.00 days | Status: CP
Start: 2023-03-18 — End: ?

## 2023-05-27 ENCOUNTER — Ambulatory Visit: Admit: 2023-05-27 | Discharge: 2023-05-28 | Payer: MEDICARE | Attending: Internal Medicine | Primary: Internal Medicine

## 2023-05-27 DIAGNOSIS — G4733 Obstructive sleep apnea (adult) (pediatric): Principal | ICD-10-CM

## 2023-05-27 DIAGNOSIS — Z951 Presence of aortocoronary bypass graft: Principal | ICD-10-CM

## 2023-05-27 DIAGNOSIS — I10 Essential (primary) hypertension: Principal | ICD-10-CM

## 2023-05-27 DIAGNOSIS — E785 Hyperlipidemia, unspecified: Principal | ICD-10-CM

## 2023-05-27 DIAGNOSIS — I2510 Atherosclerotic heart disease of native coronary artery without angina pectoris: Principal | ICD-10-CM

## 2023-05-27 MED ORDER — BEMPEDOIC ACID 180 MG TABLET
ORAL_TABLET | Freq: Every day | ORAL | 6 refills | 0 days | Status: CP
Start: 2023-05-27 — End: ?

## 2023-06-05 DIAGNOSIS — E1169 Type 2 diabetes mellitus with other specified complication: Principal | ICD-10-CM

## 2023-06-05 DIAGNOSIS — I10 Essential (primary) hypertension: Principal | ICD-10-CM

## 2023-06-05 MED ORDER — CLOPIDOGREL 75 MG TABLET
ORAL_TABLET | Freq: Every day | ORAL | 3 refills | 0.00 days
Start: 2023-06-05 — End: ?

## 2023-06-05 MED ORDER — METFORMIN ER 500 MG TABLET,EXTENDED RELEASE 24 HR
ORAL_TABLET | Freq: Two times a day (BID) | ORAL | 2 refills | 0.00 days
Start: 2023-06-05 — End: ?

## 2023-06-05 MED ORDER — FUROSEMIDE 40 MG TABLET
ORAL_TABLET | Freq: Every day | ORAL | 3 refills | 0.00 days
Start: 2023-06-05 — End: ?

## 2023-06-07 MED ORDER — FUROSEMIDE 40 MG TABLET
ORAL_TABLET | Freq: Every day | ORAL | 2 refills | 90.00 days | Status: CP
Start: 2023-06-07 — End: 2024-06-06

## 2023-06-07 MED ORDER — CLOPIDOGREL 75 MG TABLET
ORAL_TABLET | Freq: Every day | ORAL | 2 refills | 90.00 days | Status: CP
Start: 2023-06-07 — End: 2024-06-06

## 2023-06-07 MED ORDER — METFORMIN ER 500 MG TABLET,EXTENDED RELEASE 24 HR
ORAL_TABLET | Freq: Two times a day (BID) | ORAL | 2 refills | 30.00 days | Status: CP
Start: 2023-06-07 — End: ?

## 2023-07-01 ENCOUNTER — Ambulatory Visit
Admit: 2023-07-01 | Discharge: 2023-07-02 | Payer: MEDICARE | Attending: Student in an Organized Health Care Education/Training Program | Primary: Student in an Organized Health Care Education/Training Program

## 2023-07-01 DIAGNOSIS — M25471 Effusion, right ankle: Principal | ICD-10-CM

## 2023-07-01 DIAGNOSIS — M25571 Pain in right ankle and joints of right foot: Principal | ICD-10-CM

## 2023-07-12 ENCOUNTER — Ambulatory Visit
Admit: 2023-07-12 | Discharge: 2023-07-13 | Payer: MEDICARE | Attending: Student in an Organized Health Care Education/Training Program | Primary: Student in an Organized Health Care Education/Training Program

## 2023-07-12 DIAGNOSIS — E782 Mixed hyperlipidemia: Principal | ICD-10-CM

## 2023-07-12 DIAGNOSIS — I10 Essential (primary) hypertension: Principal | ICD-10-CM

## 2023-07-12 DIAGNOSIS — G4733 Obstructive sleep apnea (adult) (pediatric): Principal | ICD-10-CM

## 2023-07-12 DIAGNOSIS — E1169 Type 2 diabetes mellitus with other specified complication: Principal | ICD-10-CM

## 2023-07-12 DIAGNOSIS — H9193 Unspecified hearing loss, bilateral: Principal | ICD-10-CM

## 2023-07-12 DIAGNOSIS — Z125 Encounter for screening for malignant neoplasm of prostate: Principal | ICD-10-CM

## 2023-07-12 DIAGNOSIS — M25572 Pain in left ankle and joints of left foot: Principal | ICD-10-CM

## 2023-07-12 DIAGNOSIS — I25119 Atherosclerotic heart disease of native coronary artery with unspecified angina pectoris: Principal | ICD-10-CM

## 2023-07-12 DIAGNOSIS — Z0000 Encounter for general adult medical examination without abnormal findings: Principal | ICD-10-CM

## 2023-07-12 DIAGNOSIS — Z87891 Personal history of nicotine dependence: Principal | ICD-10-CM

## 2023-07-12 DIAGNOSIS — Z130 Encounter for screening for diseases of the blood and blood-forming organs and certain disorders involving the immune mechanism: Principal | ICD-10-CM

## 2023-07-12 DIAGNOSIS — E559 Vitamin D deficiency, unspecified: Principal | ICD-10-CM

## 2023-07-12 DIAGNOSIS — Z136 Encounter for screening for cardiovascular disorders: Principal | ICD-10-CM

## 2023-07-12 MED ORDER — TIRZEPATIDE 2.5 MG/0.5 ML SUBCUTANEOUS PEN INJECTOR
SUBCUTANEOUS | 2 refills | 28.00000 days | Status: CP
Start: 2023-07-12 — End: 2023-10-10

## 2023-07-21 ENCOUNTER — Inpatient Hospital Stay: Admit: 2023-07-21 | Discharge: 2023-07-22 | Payer: MEDICARE

## 2023-07-23 DIAGNOSIS — I714 Abdominal aortic aneurysm (AAA) without rupture, unspecified part (CMS-HCC): Principal | ICD-10-CM

## 2023-08-12 ENCOUNTER — Inpatient Hospital Stay: Admit: 2023-08-12 | Discharge: 2023-08-13 | Payer: MEDICARE

## 2023-08-12 DIAGNOSIS — I729 Aneurysm of unspecified site: Principal | ICD-10-CM

## 2023-08-12 DIAGNOSIS — R7989 Other specified abnormal findings of blood chemistry: Principal | ICD-10-CM

## 2023-08-16 ENCOUNTER — Ambulatory Visit: Admit: 2023-08-16 | Discharge: 2023-08-17 | Payer: MEDICARE

## 2023-08-16 ENCOUNTER — Encounter
Admit: 2023-08-16 | Discharge: 2023-08-17 | Payer: MEDICARE | Attending: Student in an Organized Health Care Education/Training Program | Primary: Student in an Organized Health Care Education/Training Program

## 2023-08-17 DIAGNOSIS — I7143 Infrarenal abdominal aortic aneurysm (AAA) without rupture: Principal | ICD-10-CM

## 2023-08-19 MED ORDER — GABAPENTIN 100 MG CAPSULE
ORAL_CAPSULE | Freq: Three times a day (TID) | ORAL | 0 refills | 30.00000 days | Status: CP
Start: 2023-08-19 — End: 2023-09-18

## 2023-08-24 ENCOUNTER — Ambulatory Visit: Admit: 2023-08-24 | Discharge: 2023-08-25 | Payer: MEDICARE

## 2023-08-24 ENCOUNTER — Encounter: Admit: 2023-08-24 | Discharge: 2023-08-25 | Payer: MEDICARE | Attending: Internal Medicine | Primary: Internal Medicine

## 2023-08-27 ENCOUNTER — Inpatient Hospital Stay: Admit: 2023-08-27 | Discharge: 2023-08-28 | Payer: MEDICARE

## 2023-08-27 DIAGNOSIS — I2510 Atherosclerotic heart disease of native coronary artery without angina pectoris: Principal | ICD-10-CM

## 2023-08-27 DIAGNOSIS — R0609 Other forms of dyspnea: Principal | ICD-10-CM

## 2023-08-27 DIAGNOSIS — R600 Localized edema: Principal | ICD-10-CM

## 2023-08-30 ENCOUNTER — Ambulatory Visit: Admit: 2023-08-30 | Discharge: 2023-08-31 | Payer: MEDICARE

## 2023-09-01 DIAGNOSIS — I10 Essential (primary) hypertension: Principal | ICD-10-CM

## 2023-09-01 MED ORDER — LISINOPRIL 5 MG TABLET
ORAL_TABLET | Freq: Every day | ORAL | 3 refills | 90.00000 days | Status: CP
Start: 2023-09-01 — End: ?

## 2023-09-16 DIAGNOSIS — M79671 Pain in right foot: Principal | ICD-10-CM

## 2023-09-16 MED ORDER — GABAPENTIN 100 MG CAPSULE
ORAL_CAPSULE | Freq: Three times a day (TID) | ORAL | 0 refills | 30.00000 days | Status: CP
Start: 2023-09-16 — End: ?

## 2023-09-28 ENCOUNTER — Emergency Department
Admit: 2023-09-28 | Discharge: 2023-09-29 | Disposition: A | Discharge status: Home / Self Care | Payer: MEDICARE | Attending: Student in an Organized Health Care Education/Training Program

## 2023-09-29 ENCOUNTER — Ambulatory Visit: Admit: 2023-09-29 | Discharge: 2023-09-29 | Payer: MEDICARE | Attending: Vascular Surgery | Primary: Vascular Surgery

## 2023-09-29 ENCOUNTER — Inpatient Hospital Stay: Admit: 2023-09-29 | Discharge: 2023-09-29 | Payer: MEDICARE

## 2023-09-29 DIAGNOSIS — I7143 Infrarenal abdominal aortic aneurysm (AAA) without rupture: Principal | ICD-10-CM

## 2023-09-30 DIAGNOSIS — M25572 Pain in left ankle and joints of left foot: Principal | ICD-10-CM

## 2023-09-30 DIAGNOSIS — G8929 Other chronic pain: Principal | ICD-10-CM

## 2023-09-30 DIAGNOSIS — M109 Gout, unspecified: Principal | ICD-10-CM

## 2023-09-30 MED ORDER — INDOMETHACIN 50 MG CAPSULE
ORAL_CAPSULE | ORAL | 1 refills | 0.00000 days | Status: CP
Start: 2023-09-30 — End: ?

## 2023-09-30 MED ORDER — COLCHICINE 0.6 MG TABLET
ORAL_TABLET | ORAL | 1 refills | 0.00000 days | Status: CP
Start: 2023-09-30 — End: ?

## 2023-09-30 MED ORDER — ALLOPURINOL 100 MG TABLET
ORAL_TABLET | Freq: Every day | ORAL | 2 refills | 30.00000 days | Status: CP
Start: 2023-09-30 — End: 2024-09-29

## 2023-10-14 ENCOUNTER — Encounter
Admit: 2023-10-14 | Discharge: 2023-10-14 | Payer: MEDICARE | Attending: Student in an Organized Health Care Education/Training Program | Primary: Student in an Organized Health Care Education/Training Program

## 2023-10-14 DIAGNOSIS — M109 Gout, unspecified: Principal | ICD-10-CM

## 2023-10-14 DIAGNOSIS — R195 Other fecal abnormalities: Principal | ICD-10-CM

## 2023-10-22 DIAGNOSIS — M25572 Pain in left ankle and joints of left foot: Principal | ICD-10-CM

## 2023-10-22 DIAGNOSIS — M109 Gout, unspecified: Principal | ICD-10-CM

## 2023-10-22 DIAGNOSIS — G8929 Other chronic pain: Principal | ICD-10-CM

## 2023-10-22 MED ORDER — COLCHICINE 0.6 MG TABLET
ORAL_TABLET | 1 refills | 0.00000 days
Start: 2023-10-22 — End: ?

## 2023-10-25 MED ORDER — COLCHICINE 0.6 MG TABLET
ORAL_TABLET | ORAL | 1 refills | 0.00000 days | Status: CP
Start: 2023-10-25 — End: ?

## 2023-11-29 DIAGNOSIS — G4733 Obstructive sleep apnea (adult) (pediatric): Principal | ICD-10-CM

## 2023-11-29 DIAGNOSIS — Z951 Presence of aortocoronary bypass graft: Principal | ICD-10-CM

## 2023-11-29 DIAGNOSIS — I10 Essential (primary) hypertension: Principal | ICD-10-CM

## 2023-11-29 DIAGNOSIS — E785 Hyperlipidemia, unspecified: Principal | ICD-10-CM

## 2023-11-29 DIAGNOSIS — I2510 Atherosclerotic heart disease of native coronary artery without angina pectoris: Principal | ICD-10-CM

## 2023-11-29 MED ORDER — FUROSEMIDE 40 MG TABLET
ORAL_TABLET | Freq: Every day | ORAL | 2 refills | 45.00000 days | Status: CP | PRN
Start: 2023-11-29 — End: 2024-11-28

## 2023-12-04 DIAGNOSIS — I25119 Atherosclerotic heart disease of native coronary artery with unspecified angina pectoris: Principal | ICD-10-CM

## 2023-12-04 MED ORDER — CARVEDILOL 3.125 MG TABLET
ORAL_TABLET | Freq: Two times a day (BID) | ORAL | 3 refills | 0.00000 days
Start: 2023-12-04 — End: ?

## 2023-12-09 MED ORDER — CARVEDILOL 3.125 MG TABLET
ORAL_TABLET | Freq: Two times a day (BID) | ORAL | 3 refills | 90.00000 days | Status: CP
Start: 2023-12-09 — End: ?

## 2023-12-17 DIAGNOSIS — G8929 Other chronic pain: Principal | ICD-10-CM

## 2023-12-17 DIAGNOSIS — M25572 Pain in left ankle and joints of left foot: Principal | ICD-10-CM

## 2023-12-17 DIAGNOSIS — M109 Gout, unspecified: Principal | ICD-10-CM

## 2023-12-17 MED ORDER — COLCHICINE 0.6 MG TABLET
ORAL_TABLET | ORAL | 1 refills | 0.00000 days | Status: CP
Start: 2023-12-17 — End: ?

## 2023-12-17 MED ORDER — ALLOPURINOL 100 MG TABLET
ORAL_TABLET | Freq: Every day | ORAL | 2 refills | 30.00000 days | Status: CP
Start: 2023-12-17 — End: 2024-12-16

## 2024-03-03 ENCOUNTER — Encounter: Admit: 2024-03-03 | Discharge: 2024-03-03 | Payer: MEDICARE | Attending: Family | Primary: Family

## 2024-03-03 DIAGNOSIS — R0602 Shortness of breath: Principal | ICD-10-CM

## 2024-03-03 DIAGNOSIS — R42 Dizziness and giddiness: Principal | ICD-10-CM

## 2024-03-03 DIAGNOSIS — R5383 Other fatigue: Principal | ICD-10-CM

## 2024-03-03 DIAGNOSIS — E1169 Type 2 diabetes mellitus with other specified complication: Principal | ICD-10-CM

## 2024-03-03 DIAGNOSIS — E66812 Class 2 severe obesity due to excess calories with serious comorbidity and body mass index (BMI) of 39.0 to 39.9 in adult: Principal | ICD-10-CM

## 2024-03-03 DIAGNOSIS — E785 Hyperlipidemia, unspecified: Principal | ICD-10-CM

## 2024-03-03 DIAGNOSIS — I10 Essential (primary) hypertension: Principal | ICD-10-CM

## 2024-03-03 DIAGNOSIS — Z6839 Body mass index (BMI) 39.0-39.9, adult: Principal | ICD-10-CM

## 2024-03-12 DIAGNOSIS — I10 Essential (primary) hypertension: Principal | ICD-10-CM

## 2024-03-12 MED ORDER — CLOPIDOGREL 75 MG TABLET
ORAL_TABLET | Freq: Every day | ORAL | 2 refills | 0.00000 days
Start: 2024-03-12 — End: ?

## 2024-03-13 MED ORDER — CLOPIDOGREL 75 MG TABLET
ORAL_TABLET | Freq: Every day | ORAL | 2 refills | 90.00000 days | Status: CP
Start: 2024-03-13 — End: ?

## 2024-03-23 DIAGNOSIS — G8929 Other chronic pain: Principal | ICD-10-CM

## 2024-03-23 DIAGNOSIS — M25572 Pain in left ankle and joints of left foot: Principal | ICD-10-CM

## 2024-03-23 DIAGNOSIS — M109 Gout, unspecified: Principal | ICD-10-CM

## 2024-03-23 DIAGNOSIS — E1169 Type 2 diabetes mellitus with other specified complication: Principal | ICD-10-CM

## 2024-03-23 MED ORDER — ALLOPURINOL 100 MG TABLET
ORAL_TABLET | Freq: Every day | ORAL | 3 refills | 90.00000 days | Status: CP
Start: 2024-03-23 — End: ?

## 2024-03-23 MED ORDER — METFORMIN ER 500 MG TABLET,EXTENDED RELEASE 24 HR
ORAL_TABLET | Freq: Two times a day (BID) | ORAL | 2 refills | 30.00000 days | Status: CP
Start: 2024-03-23 — End: ?
